# Patient Record
Sex: Female | Born: 1975 | Race: Black or African American | Hispanic: No | Marital: Married | Smoking: Never smoker
Health system: Southern US, Community
[De-identification: ages and names within clinical notes are randomized; demographics above are authoritative.]

## PROBLEM LIST (undated history)

## (undated) DIAGNOSIS — I341 Nonrheumatic mitral (valve) prolapse: Secondary | ICD-10-CM

## (undated) DIAGNOSIS — D649 Anemia, unspecified: Secondary | ICD-10-CM

## (undated) DIAGNOSIS — H109 Unspecified conjunctivitis: Secondary | ICD-10-CM

## (undated) DIAGNOSIS — T7840XA Allergy, unspecified, initial encounter: Secondary | ICD-10-CM

## (undated) DIAGNOSIS — M1990 Unspecified osteoarthritis, unspecified site: Secondary | ICD-10-CM

## (undated) HISTORY — PX: NO PAST SURGERIES: SHX2092

## (undated) HISTORY — DX: Allergy, unspecified, initial encounter: T78.40XA

---

## 1999-06-21 ENCOUNTER — Other Ambulatory Visit: Admission: RE | Admit: 1999-06-21 | Discharge: 1999-06-21 | Payer: Self-pay | Admitting: Obstetrics & Gynecology

## 2000-04-06 ENCOUNTER — Emergency Department (HOSPITAL_COMMUNITY): Admission: EM | Admit: 2000-04-06 | Discharge: 2000-04-06 | Payer: Self-pay | Admitting: Internal Medicine

## 2000-07-02 ENCOUNTER — Other Ambulatory Visit: Admission: RE | Admit: 2000-07-02 | Discharge: 2000-07-02 | Payer: Self-pay | Admitting: Obstetrics & Gynecology

## 2001-07-30 ENCOUNTER — Other Ambulatory Visit: Admission: RE | Admit: 2001-07-30 | Discharge: 2001-07-30 | Payer: Self-pay | Admitting: Obstetrics & Gynecology

## 2001-10-30 ENCOUNTER — Emergency Department (HOSPITAL_COMMUNITY): Admission: EM | Admit: 2001-10-30 | Discharge: 2001-10-30 | Payer: Self-pay | Admitting: Emergency Medicine

## 2001-10-30 ENCOUNTER — Encounter: Payer: Self-pay | Admitting: Emergency Medicine

## 2002-09-25 ENCOUNTER — Other Ambulatory Visit: Admission: RE | Admit: 2002-09-25 | Discharge: 2002-09-25 | Payer: Self-pay | Admitting: Obstetrics & Gynecology

## 2003-09-11 ENCOUNTER — Inpatient Hospital Stay (HOSPITAL_COMMUNITY): Admission: AD | Admit: 2003-09-11 | Discharge: 2003-09-13 | Payer: Self-pay | Admitting: Internal Medicine

## 2003-10-12 ENCOUNTER — Other Ambulatory Visit: Admission: RE | Admit: 2003-10-12 | Discharge: 2003-10-12 | Payer: Self-pay | Admitting: Obstetrics & Gynecology

## 2004-10-12 ENCOUNTER — Ambulatory Visit: Payer: Self-pay | Admitting: Internal Medicine

## 2004-12-01 ENCOUNTER — Other Ambulatory Visit: Admission: RE | Admit: 2004-12-01 | Discharge: 2004-12-01 | Payer: Self-pay | Admitting: Obstetrics & Gynecology

## 2010-06-16 ENCOUNTER — Emergency Department (HOSPITAL_COMMUNITY)
Admission: EM | Admit: 2010-06-16 | Discharge: 2010-06-16 | Payer: Self-pay | Source: Home / Self Care | Admitting: Emergency Medicine

## 2011-11-19 ENCOUNTER — Ambulatory Visit (INDEPENDENT_AMBULATORY_CARE_PROVIDER_SITE_OTHER): Payer: 59 | Admitting: Physician Assistant

## 2011-11-19 VITALS — BP 121/70 | HR 91 | Temp 98.3°F | Resp 16 | Ht 65.4 in | Wt 155.4 lb

## 2011-11-19 DIAGNOSIS — J310 Chronic rhinitis: Secondary | ICD-10-CM

## 2011-11-19 DIAGNOSIS — J029 Acute pharyngitis, unspecified: Secondary | ICD-10-CM

## 2011-11-19 DIAGNOSIS — R066 Hiccough: Secondary | ICD-10-CM

## 2011-11-19 DIAGNOSIS — R05 Cough: Secondary | ICD-10-CM

## 2011-11-19 LAB — POCT RAPID STREP A (OFFICE): Rapid Strep A Screen: NEGATIVE

## 2011-11-19 MED ORDER — IPRATROPIUM BROMIDE 0.03 % NA SOLN: 2.0000 | Freq: Two times a day (BID) | NASAL | Status: DC

## 2011-11-19 MED ORDER — GUAIFENESIN ER 1200 MG PO TB12: 1.0000 | ORAL_TABLET | Freq: Two times a day (BID) | ORAL | Status: DC | PRN

## 2011-11-19 MED ORDER — MAGIC MOUTHWASH W/LIDOCAINE: 10.0000 mL | ORAL | Status: DC | PRN

## 2011-11-19 MED ORDER — HYDROCOD POLST-CHLORPHEN POLST 10-8 MG/5ML PO LQCR: 5.0000 mL | Freq: Two times a day (BID) | ORAL | Status: DC | PRN

## 2011-11-19 NOTE — Patient Instructions (Signed)
Get LOTS of rest and drink at least 64 ounces of water daily.

## 2011-11-19 NOTE — Progress Notes (Signed)
  Subjective:    Patient ID: Kelly Sharp, female    DOB: 1976-02-23, 36 y.o.   MRN: [id number]  HPI Presents with 2 days of sore throat.  No fever, chills, GU/GI symptoms.  No myalgias.  No rash.  Mild nasal congestion.  Cough kept her awake last night.  Exposed to strep last week (reports mom was diagnosed on 11/15/2011).  Reports history of strep throat 2-3 times annually.   Review of Systems As above.    Objective:   Physical Exam  Vital signs noted. Well-developed, well nourished BF who is awake, alert and oriented, in NAD. HEENT: [place]/AT, PERRL, EOMI.  Sclera and conjunctiva are clear.  EAC are patent, TMs are normal in appearance. Nasal mucosa is pink and moist. OP is clear. Neck: supple, non-tender, no lymphadenopathy, thyromegaly. Heart: RRR, no murmur Lungs: CTA Skin: warm and dry without rash.  Results for orders placed in visit on 11/19/11  POCT RAPID STREP A (OFFICE)      Component Value Range   Rapid Strep A Screen Negative  Negative         Assessment & Plan:   1. Acute pharyngitis  POCT rapid strep A, Culture, Group A Strep, Alum & Mag Hydroxide-Simeth (MAGIC MOUTHWASH W/LIDOCAINE) SOLN  2. Rhinitis  ipratropium (ATROVENT) 0.03 % nasal spray, Guaifenesin (MUCINEX MAXIMUM STRENGTH) 1200 MG TB12  3. Cough  Tussionex 5 ml PO Q12 hours prn   Patient Instructions  Get LOTS of rest and drink at least 64 ounces of water daily.

## 2011-11-21 LAB — CULTURE, GROUP A STREP: Organism ID, Bacteria: NORMAL

## 2011-11-23 ENCOUNTER — Encounter: Payer: Self-pay | Admitting: *Deleted

## 2012-04-22 ENCOUNTER — Ambulatory Visit (INDEPENDENT_AMBULATORY_CARE_PROVIDER_SITE_OTHER): Payer: 59 | Admitting: Physician Assistant

## 2012-04-22 VITALS — BP 142/88 | HR 96 | Temp 98.2°F | Resp 18 | Ht 64.5 in | Wt 155.0 lb

## 2012-04-22 DIAGNOSIS — J029 Acute pharyngitis, unspecified: Secondary | ICD-10-CM

## 2012-04-22 DIAGNOSIS — J019 Acute sinusitis, unspecified: Secondary | ICD-10-CM

## 2012-04-22 DIAGNOSIS — R05 Cough: Secondary | ICD-10-CM

## 2012-04-22 LAB — POCT RAPID STREP A (OFFICE): Rapid Strep A Screen: NEGATIVE

## 2012-04-22 MED ORDER — HYDROCOD POLST-CHLORPHEN POLST 10-8 MG/5ML PO LQCR: 5.0000 mL | Freq: Two times a day (BID) | ORAL | Status: DC | PRN

## 2012-04-22 MED ORDER — AZITHROMYCIN 250 MG PO TABS: ORAL_TABLET | ORAL | Status: DC

## 2012-04-22 NOTE — Progress Notes (Signed)
Patient ID: Kelly Sharp MRN: [id number], DOB: 01/28/1976, 36 y.o. Date of Encounter: 04/22/2012, 9:59 AM  Primary Physician: No primary provider on file.  Chief Complaint:  Chief Complaint  Patient presents with  . Sore Throat    scratchy  . Nasal Congestion  . Sinus Problem    since pressure    HPI: 36 y.o. year old female presents with a three to four day history of nasal congestion, post nasal drip, sore throat, sinus pressure, and cough. Afebrile. No chills. Nasal congestion thick and green/yellow. Sinus pressure is the worst symptom along the frontal sinuses. Cough is productive secondary to post nasal drip and not associated with time of day. No shortness of breath or wheezing. Ears feel full, leading to sensation of muffled hearing. Has tried OTC cold preps without success. No GI complaints. Appetite slightly decreased today. Son is sick with similar illness. She is leaving today for a week long trip to [place] with her husband to celebrate their 10 year wedding anniversary.   No recent antibiotics, or recent travels   No leg trauma, sedentary periods, h/o cancer, or tobacco use.  Past Medical History  Diagnosis Date  . Allergy      Home Meds: Prior to Admission medications   Medication Sig Start Date End Date Taking? Authorizing Provider  norgestimate-ethinyl estradiol (ORTHO-CYCLEN,SPRINTEC,PREVIFEM) 0.25-35 MG-MCG tablet Take 1 tablet by mouth daily.   Yes Historical Provider, MD                                Allergies: No Known Allergies  History   Social History  . Marital Status: Single    Spouse Name: N/A    Number of Children: N/A  . Years of Education: N/A   Occupational History  . Not on file.   Social History Main Topics  . Smoking status: Never Smoker   . Smokeless tobacco: Not on file  . Alcohol Use: Yes  . Drug Use: No  . Sexually Active: Yes     1   Other Topics Concern  . Not on file   Social History Narrative  . No  narrative on file     Review of Systems: Constitutional: negative for chills, fever, night sweats or weight changes Cardiovascular: negative for chest pain or palpitations Respiratory: negative for hemoptysis, wheezing, or shortness of breath Abdominal: negative for abdominal pain, nausea, vomiting or diarrhea Dermatological: negative for rash Neurologic: negative for headache   Physical Exam: Blood pressure 142/88, pulse 96, temperature 98.2 F (36.8 C), temperature source Oral, resp. rate 18, height 5' 4.5" (1.638 m), weight 155 lb (70.308 kg), last menstrual period 03/25/2012, SpO2 97.00%., Body mass index is 26.19 kg/(m^2). General: Well developed, well nourished, in no acute distress. Head: Normocephalic, atraumatic, eyes without discharge, sclera non-icteric, nares are congested. Bilateral auditory canals clear, TM's are without perforation, pearly grey with reflective cone of light bilaterally. Serous effusion bilaterally behind TM's. Frontal sinus TTP. Oral cavity moist, dentition normal. Posterior pharynx with post nasal drip and mild erythema. No peritonsillar abscess or tonsillar exudate. Neck: Supple. No thyromegaly. Full ROM. No lymphadenopathy. Lungs: Clear bilaterally to auscultation without wheezes, rales, or rhonchi. Breathing is unlabored.  Heart: RRR with S1 S2. No murmurs, rubs, or gallops appreciated. Msk:  Strength and tone normal for age. Extremities: No clubbing or cyanosis. No edema. Neuro: Alert and oriented X 3. Moves all extremities spontaneously. CNII-XII grossly in tact.  Psych:  Responds to questions appropriately with a normal affect.   Labs: Results for orders placed in visit on 04/22/12  POCT RAPID STREP A (OFFICE)      Component Value Range   Rapid Strep A Screen Negative  Negative    ASSESSMENT AND PLAN:  36 y.o. year old female with sinusitis and cough secondary to post nasal drip -Azithromycin 250 MG #6 2 po first day then 1 po next 4 days no RF,  start if not feeling better in 2-3 days -Tussionex 1 tsp po q 12 hours prn cough #90 mL no RF -Use Atrovent NS as directed, has at home -Mucinex -Tylenol/Motrin prn -Rest/fluids -RTC precautions -RTC 3-5 days if no improvement  Signed, Eula Listen, PA-C 04/22/2012 9:59 AM

## 2013-09-26 ENCOUNTER — Ambulatory Visit (INDEPENDENT_AMBULATORY_CARE_PROVIDER_SITE_OTHER): Payer: 59 | Admitting: Family Medicine

## 2013-09-26 VITALS — BP 110/60 | HR 85 | Temp 98.5°F | Resp 12 | Ht 65.0 in | Wt 163.0 lb

## 2013-09-26 DIAGNOSIS — R058 Other specified cough: Secondary | ICD-10-CM

## 2013-09-26 DIAGNOSIS — R059 Cough, unspecified: Secondary | ICD-10-CM

## 2013-09-26 DIAGNOSIS — J209 Acute bronchitis, unspecified: Secondary | ICD-10-CM

## 2013-09-26 DIAGNOSIS — R05 Cough: Secondary | ICD-10-CM

## 2013-09-26 DIAGNOSIS — R269 Unspecified abnormalities of gait and mobility: Secondary | ICD-10-CM

## 2013-09-26 MED ORDER — HYDROCOD POLST-CHLORPHEN POLST 10-8 MG/5ML PO LQCR: 5.0000 mL | Freq: Two times a day (BID) | ORAL | Status: DC | PRN

## 2013-09-26 MED ORDER — PREDNISONE 20 MG PO TABS: ORAL_TABLET | ORAL | Status: DC

## 2013-09-26 MED ORDER — AMOXICILLIN 875 MG PO TABS: 875.0000 mg | ORAL_TABLET | Freq: Two times a day (BID) | ORAL | Status: DC

## 2013-09-26 MED ORDER — BENZONATATE 100 MG PO CAPS: 100.0000 mg | ORAL_CAPSULE | Freq: Three times a day (TID) | ORAL | Status: DC | PRN

## 2013-09-26 NOTE — Progress Notes (Signed)
Subjective: 38 year old lady who is here with a respiratory tract infection for 2 weeks. Early on she went to see her primary care doctor. She had a sore throat at first, with some head congestion and cough. The throat has gradually resolved but the cough has gotten steadily worse and more persistent, keeping her awake at night. It is not very productive. No foul sputum. Her primary care treat her symptomatically, and the throat improved but the other problems have persisted.  Objective: In early a healthy young lady. Her TMs are normal. Throat clear. Neck supple without significant nodes. She has a intermittent hacking cough. Lungs are clear. Heart regular without murmurs. She has had some chest wall pain, primarily with deep breathing or coughing.  Assessment: Post viral cough syndrome Bronchitis  Plan:  No special studies are being done today. Will treat with a course of antibiotics even though it may still just be viral, since it is been going on for long period. Also giving him a taper of prednisone and cough suppressant medication. Return if worse

## 2013-09-26 NOTE — Patient Instructions (Signed)
Drink plenty of fluids and try to get enough rest.  Take the amoxicillin one twice daily  Use the Tussionex 1 teaspoon every 12 hurts as needed for cough. May wish to avoid this in the daytime because of the drowsiness.  In the daytime and use the Tessalon one or 2 pills 3 times daily. These are not as strong as a Tussionex, but can take the edge off the cough some  Take the prednisone 3 pills daily for 2 days, then 2 daily for 2 days, then one daily for 2 days. These are best taken earlier in the day.  Return if worse

## 2014-03-09 ENCOUNTER — Ambulatory Visit: Payer: 59 | Attending: Orthopedic Surgery

## 2014-03-09 DIAGNOSIS — M25569 Pain in unspecified knee: Secondary | ICD-10-CM | POA: Diagnosis not present

## 2014-03-09 DIAGNOSIS — IMO0001 Reserved for inherently not codable concepts without codable children: Secondary | ICD-10-CM | POA: Insufficient documentation

## 2014-03-09 DIAGNOSIS — M6281 Muscle weakness (generalized): Secondary | ICD-10-CM | POA: Insufficient documentation

## 2014-03-16 ENCOUNTER — Ambulatory Visit: Payer: 59

## 2014-03-16 DIAGNOSIS — IMO0001 Reserved for inherently not codable concepts without codable children: Secondary | ICD-10-CM | POA: Diagnosis not present

## 2014-03-18 ENCOUNTER — Ambulatory Visit: Payer: 59 | Admitting: Rehabilitation

## 2014-03-18 DIAGNOSIS — IMO0001 Reserved for inherently not codable concepts without codable children: Secondary | ICD-10-CM | POA: Diagnosis not present

## 2014-03-25 ENCOUNTER — Ambulatory Visit: Payer: 59 | Attending: Orthopedic Surgery

## 2014-03-25 DIAGNOSIS — M6281 Muscle weakness (generalized): Secondary | ICD-10-CM | POA: Insufficient documentation

## 2014-03-25 DIAGNOSIS — M25562 Pain in left knee: Secondary | ICD-10-CM | POA: Diagnosis not present

## 2014-03-25 DIAGNOSIS — Z5189 Encounter for other specified aftercare: Secondary | ICD-10-CM | POA: Diagnosis not present

## 2014-03-30 ENCOUNTER — Ambulatory Visit: Payer: 59 | Admitting: Rehabilitation

## 2014-03-30 DIAGNOSIS — Z5189 Encounter for other specified aftercare: Secondary | ICD-10-CM | POA: Diagnosis not present

## 2016-06-04 ENCOUNTER — Ambulatory Visit: Payer: Self-pay | Admitting: Orthopedic Surgery

## 2016-06-06 ENCOUNTER — Ambulatory Visit: Payer: Self-pay | Admitting: Orthopedic Surgery

## 2016-06-06 NOTE — H&P (Signed)
Kelly Sharp is an 40 y.o. female.   Chief Complaint: back and RLE pain HPI: The patient is a 40 year old female who presents today for follow up of their back. The patient is being followed for their low back symptoms. They are now 9 week(s) out from when symptoms began. Symptoms reported today include: pain. Current treatment includes: activity modification, NSAIDs and pain medications. The following medication has been used for pain control: Norco and Advil. The patient presents today following MRI.  Kelly Sharp follows up with an MRI. She does have a moderately large disc herniation L5-S1 to the right, displacing the S1 nerve root.  She has had physical therapy. She has had a relapse. She has been unable to return to work.  Past Medical History:  Diagnosis Date  . Allergy     No past surgical history on file.  Family History  Problem Relation Age of Onset  . Thyroid disease Mother   . Diabetes Father   . Hyperlipidemia Father   . Diabetes Maternal Grandmother   . Diabetes Paternal Grandmother    Social History:  reports that she has never smoked. She has never used smokeless tobacco. She reports that she drinks alcohol. She reports that she does not use drugs.  Allergies: No Known Allergies   (Not in a hospital admission)  No results found for this or any previous visit (from the past 48 hour(s)). No results found.  Review of Systems  Constitutional: Negative.   HENT: Negative.   Eyes: Negative.   Respiratory: Negative.   Cardiovascular: Negative.   Gastrointestinal: Negative.   Genitourinary: Negative.   Musculoskeletal: Positive for back pain.  Skin: Negative.   Neurological: Positive for sensory change and focal weakness.  Psychiatric/Behavioral: Negative.     There were no vitals taken for this visit. Physical Exam  Constitutional: She is oriented to person, place, and time. She appears well-developed and well-nourished. She appears distressed.  HENT:   Head: Normocephalic.  Eyes: Pupils are equal, round, and reactive to light.  Neck: Normal range of motion.  Cardiovascular: Normal rate.   Respiratory: Effort normal.  GI: Soft.  Musculoskeletal:  Healthy. Moderate to severe distress. Mood and affect is appropriate. Her straight leg raises buttock, thigh and calf pain on the right and is positive at 10 degrees, negative on the left. She has trace EHL weakness on plantar flexion and weakness on the right compared to the left, pain with flexion. She can only relieve symptoms in the prone position.  Neurological: She is alert and oriented to person, place, and time.  Skin: Skin is warm and dry.    MRI demonstrates a neurocompressive lesion and disc herniation at 5-1 displacing it to the right, I feel it is moderately large. She has associated disc degeneration.  Assessment/Plan 1. 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 2. 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  We discussed her pathology, relevant anatomy and treatment at this point in time. Given that she is nine weeks status post she has had exacerbation and has a markedly positive neuro tension signs with early myotomal weakness I feel that microlumbar decompression at this point in time would be appropriate option. She is to remain out of work, has not been able to return. I do not feel a corticosteroid injection would be beneficial as there is neural compression at this point. Again she is nine weeks status post I gave  her note to be out of work, discussed lumbar decompression in detail. I had an extensive discussion of the risks and benefits of the lumbar decompression with the patient including bleeding, infection, damage to neurovascular structures, epidural fibrosis, CSF leak requiring repair. We also discussed increase in pain, adjacent segment disease, recurrent disc herniation, need for future  surgery including repeat decompression and/or fusion. We also discussed risks of postoperative hematoma, paralysis, anesthetic complications including DVT, PE, death, cardiopulmonary dysfunction. In addition, the perioperative and postoperative courses were discussed in detail including the rehabilitative time and return to functional activity and work. I provided the patient with an illustrated handout and utilized the appropriate surgical models. We will keep a note to be out of work. She is to avoid stretching of the nerve. At work when she returns, we discussed appropriate ergonomic, standing desk, etc. Any change in the interim, she is to call or her husband call in as well and we spoke with him. Her son was here as well. Again, she is fairly miserable and has had nine weeks of persistent symptoms despite physical therapy, activity modification, home exercise program, etc.  Plan microlumbar decompression L5-S1 right  Kelly Sharp., PA-C  For Dr. Tonita Cong 06/06/2016, 1:26 PM

## 2016-06-13 ENCOUNTER — Encounter (HOSPITAL_COMMUNITY): Payer: Self-pay

## 2016-06-13 NOTE — Patient Instructions (Signed)
Kelly Sharp  06/13/2016   Your procedure is scheduled on: 06/20/16  Report to [facility] Main  Entrance take [facility]  elevators to 3rd floor to  [place] at     11:00 AM.  Call this number if you have problems the morning of surgery [phone]   Remember: ONLY 1 PERSON MAY GO WITH YOU TO SHORT STAY TO GET  READY MORNING OF [place].  Do not eat food or drink liquids :After Midnight.     Take these medicines the morning of surgery with A SIP OF WATER: Hydrocodone if needed, eye drops                                You may not have any metal on your body including hair pins and              piercings  Do not wear jewelry, make-up, lotions, powders or perfumes, deodorant             Do not wear nail polish.  Do not shave  48 hours prior to surgery.          Do not bring valuables to the hospital. [place].  Contacts, dentures or bridgework may not be worn into surgery.  Leave suitcase in the car. After surgery it may be brought to your room.        Special Instructions: N/A              Please read over the following fact sheets you were given: _____________________________________________________________________             [facility] - Preparing for Surgery Before surgery, you can play an important role.  Because skin is not sterile, your skin needs to be as free of germs as possible.  You can reduce the number of germs on your skin by washing with CHG (chlorahexidine gluconate) soap before surgery.  CHG is an antiseptic cleaner which kills germs and bonds with the skin to continue killing germs even after washing. Please DO NOT use if you have an allergy to CHG or antibacterial soaps.  If your skin becomes reddened/irritated stop using the CHG and inform your nurse when you arrive at Short Stay. Do not shave (including legs and underarms) for at least 48 hours prior to the first CHG  shower.  You may shave your face/neck. Please follow these instructions carefully:  1.  Shower with CHG Soap the night before surgery and the  morning of Surgery.  2.  If you choose to wash your hair, wash your hair first as usual with your  normal  shampoo.  3.  After you shampoo, rinse your hair and body thoroughly to remove the  shampoo.                           4.  Use CHG as you would any other liquid soap.  You can apply chg directly  to the skin and wash                       Gently with a scrungie or clean washcloth.  5.  Apply the CHG  Soap to your body ONLY FROM THE NECK DOWN.   Do not use on face/ open                           Wound or open sores. Avoid contact with eyes, ears mouth and genitals (private parts).                       Wash face,  Genitals (private parts) with your normal soap.             6.  Wash thoroughly, paying special attention to the area where your surgery  will be performed.  7.  Thoroughly rinse your body with warm water from the neck down.  8.  DO NOT shower/wash with your normal soap after using and rinsing off  the CHG Soap.                9.  Pat yourself dry with a clean towel.            10.  Wear clean pajamas.            11.  Place clean sheets on your bed the night of your first shower and do not  sleep with pets. Day of Surgery : Do not apply any lotions/deodorants the morning of surgery.  Please wear clean clothes to the hospital/surgery center.  FAILURE TO FOLLOW THESE INSTRUCTIONS MAY RESULT IN THE CANCELLATION OF YOUR SURGERY PATIENT SIGNATURE_________________________________  NURSE SIGNATURE__________________________________  ________________________________________________________________________  WHAT IS A BLOOD TRANSFUSION? Blood Transfusion Information  A transfusion is the replacement of blood or some of its parts. Blood is made up of multiple cells which provide different functions.  Red blood cells carry oxygen and are used for  blood loss replacement.  White blood cells fight against infection.  Platelets control bleeding.  Plasma helps clot blood.  Other blood products are available for specialized needs, such as hemophilia or other clotting disorders. BEFORE THE TRANSFUSION  Who gives blood for transfusions?   Healthy volunteers who are fully evaluated to make sure their blood is safe. This is blood bank blood. Transfusion therapy is the safest it has ever been in the practice of medicine. Before blood is taken from a donor, a complete history is taken to make sure that person has no history of diseases nor engages in risky social behavior (examples are intravenous drug use or sexual activity with multiple partners). The donor's travel history is screened to minimize risk of transmitting infections, such as malaria. The donated blood is tested for signs of infectious diseases, such as HIV and hepatitis. The blood is then tested to be sure it is compatible with you in order to minimize the chance of a transfusion reaction. If you or a relative donates blood, this is often done in anticipation of surgery and is not appropriate for emergency situations. It takes many days to process the donated blood. RISKS AND COMPLICATIONS Although transfusion therapy is very safe and saves many lives, the main dangers of transfusion include:   Getting an infectious disease.  Developing a transfusion reaction. This is an allergic reaction to something in the blood you were given. Every precaution is taken to prevent this. The decision to have a blood transfusion has been considered carefully by your caregiver before blood is given. Blood is not given unless the benefits outweigh the risks. AFTER THE TRANSFUSION  Right after receiving a blood transfusion, you  will usually feel much better and more energetic. This is especially true if your red blood cells have gotten low (anemic). The transfusion raises the level of the red blood  cells which carry oxygen, and this usually causes an energy increase.  The nurse administering the transfusion will monitor you carefully for complications. HOME CARE INSTRUCTIONS  No special instructions are needed after a transfusion. You may find your energy is better. Speak with your caregiver about any limitations on activity for underlying diseases you may have. SEEK MEDICAL CARE IF:   Your condition is not improving after your transfusion.  You develop redness or irritation at the intravenous (IV) site. SEEK IMMEDIATE MEDICAL CARE IF:  Any of the following symptoms occur over the next 12 hours:  Shaking chills.  You have a temperature by mouth above 102 F (38.9 C), not controlled by medicine.  Chest, back, or muscle pain.  People around you feel you are not acting correctly or are confused.  Shortness of breath or difficulty breathing.  Dizziness and fainting.  You get a rash or develop hives.  You have a decrease in urine output.  Your urine turns a dark color or changes to pink, red, or brown. Any of the following symptoms occur over the next 10 days:  You have a temperature by mouth above 102 F (38.9 C), not controlled by medicine.  Shortness of breath.  Weakness after normal activity.  The white part of the eye turns yellow (jaundice).  You have a decrease in the amount of urine or are urinating less often.  Your urine turns a dark color or changes to pink, red, or brown. Document Released: 06/08/2000 Document Revised: 09/03/2011 Document Reviewed: 01/26/2008 ExitCare Patient Information 2014 [place].  _______________________________________________________________________  Incentive Spirometer  An incentive spirometer is a tool that can help keep your lungs clear and active. This tool measures how well you are filling your lungs with each breath. Taking long deep breaths may help reverse or decrease the chance of developing breathing  (pulmonary) problems (especially infection) following:  A long period of time when you are unable to move or be active. BEFORE THE PROCEDURE   If the spirometer includes an indicator to show your best effort, your nurse or respiratory therapist will set it to a desired goal.  If possible, sit up straight or lean slightly forward. Try not to slouch.  Hold the incentive spirometer in an upright position. INSTRUCTIONS FOR USE  1. Sit on the edge of your bed if possible, or sit up as far as you can in bed or on a chair. 2. Hold the incentive spirometer in an upright position. 3. Breathe out normally. 4. Place the mouthpiece in your mouth and seal your lips tightly around it. 5. Breathe in slowly and as deeply as possible, raising the piston or the ball toward the top of the column. 6. Hold your breath for 3-5 seconds or for as long as possible. Allow the piston or ball to fall to the bottom of the column. 7. Remove the mouthpiece from your mouth and breathe out normally. 8. Rest for a few seconds and repeat Steps 1 through 7 at least 10 times every 1-2 hours when you are awake. Take your time and take a few normal breaths between deep breaths. 9. The spirometer may include an indicator to show your best effort. Use the indicator as a goal to work toward during each repetition. 10. After each set of 10 deep breaths, practice  coughing to be sure your lungs are clear. If you have an incision (the cut made at the time of surgery), support your incision when coughing by placing a pillow or rolled up towels firmly against it. Once you are able to get out of bed, walk around indoors and cough well. You may stop using the incentive spirometer when instructed by your caregiver.  RISKS AND COMPLICATIONS  Take your time so you do not get dizzy or light-headed.  If you are in pain, you may need to take or ask for pain medication before doing incentive spirometry. It is harder to take a deep breath if you  are having pain. AFTER USE  Rest and breathe slowly and easily.  It can be helpful to keep track of a log of your progress. Your caregiver can provide you with a simple table to help with this. If you are using the spirometer at home, follow these instructions: [place] IF:   You are having difficultly using the spirometer.  You have trouble using the spirometer as often as instructed.  Your pain medication is not giving enough relief while using the spirometer.  You develop fever of 100.5 F (38.1 C) or higher. SEEK IMMEDIATE MEDICAL CARE IF:   You cough up bloody sputum that had not been present before.  You develop fever of 102 F (38.9 C) or greater.  You develop worsening pain at or near the incision site. MAKE SURE YOU:   Understand these instructions.  Will watch your condition.  Will get help right away if you are not doing well or get worse. Document Released: 10/22/2006 Document Revised: 09/03/2011 Document Reviewed: 12/23/2006 [facility] Patient Information 2014 [place], [place].   ________________________________________________________________________

## 2016-06-14 ENCOUNTER — Encounter (HOSPITAL_COMMUNITY): Payer: Self-pay

## 2016-06-14 ENCOUNTER — Ambulatory Visit (HOSPITAL_COMMUNITY)
Admission: RE | Admit: 2016-06-14 | Discharge: 2016-06-14 | Disposition: A | Discharge status: Home / Self Care | Payer: 59 | Source: Ambulatory Visit | Attending: Orthopedic Surgery | Admitting: Orthopedic Surgery

## 2016-06-14 ENCOUNTER — Encounter (HOSPITAL_COMMUNITY)
Admission: RE | Admit: 2016-06-14 | Discharge: 2016-06-14 | Disposition: A | Discharge status: Home / Self Care | Payer: 59 | Source: Ambulatory Visit | Attending: Specialist | Admitting: Specialist

## 2016-06-14 DIAGNOSIS — Z01812 Encounter for preprocedural laboratory examination: Secondary | ICD-10-CM | POA: Diagnosis present

## 2016-06-14 DIAGNOSIS — M5126 Other intervertebral disc displacement, lumbar region: Secondary | ICD-10-CM | POA: Diagnosis not present

## 2016-06-14 DIAGNOSIS — Z01818 Encounter for other preprocedural examination: Secondary | ICD-10-CM | POA: Diagnosis present

## 2016-06-14 HISTORY — DX: Nonrheumatic mitral (valve) prolapse: I34.1

## 2016-06-14 HISTORY — DX: Unspecified conjunctivitis: H10.9

## 2016-06-14 HISTORY — DX: Unspecified osteoarthritis, unspecified site: M19.90

## 2016-06-14 LAB — CBC
HEMATOCRIT: 38.2 % (ref 36.0–46.0)
Hemoglobin: 12.2 g/dL (ref 12.0–15.0)
MCH: 27.4 pg (ref 26.0–34.0)
MCHC: 31.9 g/dL (ref 30.0–36.0)
MCV: 85.8 fL (ref 78.0–100.0)
Platelets: 385 10*3/uL (ref 150–400)
RBC: 4.45 MIL/uL (ref 3.87–5.11)
RDW: 13 % (ref 11.5–15.5)
WBC: 5.9 10*3/uL (ref 4.0–10.5)

## 2016-06-14 LAB — ABO/RH: ABO/RH(D): A POS

## 2016-06-14 LAB — BASIC METABOLIC PANEL
ANION GAP: 7 (ref 5–15)
BUN: 11 mg/dL (ref 6–20)
CO2: 29 mmol/L (ref 22–32)
Calcium: 9.1 mg/dL (ref 8.9–10.3)
Chloride: 101 mmol/L (ref 101–111)
Creatinine, Ser: 0.77 mg/dL (ref 0.44–1.00)
Glucose, Bld: 99 mg/dL (ref 65–99)
POTASSIUM: 4.1 mmol/L (ref 3.5–5.1)
SODIUM: 137 mmol/L (ref 135–145)

## 2016-06-14 LAB — SURGICAL PCR SCREEN
MRSA, PCR: NEGATIVE
STAPHYLOCOCCUS AUREUS: NEGATIVE

## 2016-06-14 LAB — PREGNANCY, URINE: Preg Test, Ur: NEGATIVE

## 2016-06-14 NOTE — Progress Notes (Signed)
Patient reported history of pink eye treated by Dr Burman Blacksmith at [place] approximately 05/30/2016.  Patient on eye antibiotic solution.  Patient reports eye improved but felt on 06/10/16 that eye " felt funny " and started using eye antibiotic solution again that had been prescribed by MD.  Instructed patient to call and inform Judeen Hammans, Cabin crew for [employer].  Phone number and extension provided.

## 2016-06-20 ENCOUNTER — Ambulatory Visit (HOSPITAL_COMMUNITY): Payer: 59

## 2016-06-20 ENCOUNTER — Encounter (HOSPITAL_COMMUNITY)
Admission: RE | Disposition: A | Discharge status: Home / Self Care | Payer: Self-pay | Source: Ambulatory Visit | Attending: Specialist

## 2016-06-20 ENCOUNTER — Ambulatory Visit (HOSPITAL_COMMUNITY): Payer: 59 | Admitting: Anesthesiology

## 2016-06-20 ENCOUNTER — Ambulatory Visit (HOSPITAL_COMMUNITY)
Admission: RE | Admit: 2016-06-20 | Discharge: 2016-06-22 | Disposition: A | Discharge status: Home / Self Care | Payer: 59 | Source: Ambulatory Visit | Attending: Specialist | Admitting: Specialist

## 2016-06-20 ENCOUNTER — Encounter (HOSPITAL_COMMUNITY): Payer: Self-pay

## 2016-06-20 DIAGNOSIS — M5117 Intervertebral disc disorders with radiculopathy, lumbosacral region: Secondary | ICD-10-CM | POA: Insufficient documentation

## 2016-06-20 DIAGNOSIS — M48061 Spinal stenosis, lumbar region without neurogenic claudication: Secondary | ICD-10-CM | POA: Diagnosis present

## 2016-06-20 DIAGNOSIS — Z419 Encounter for procedure for purposes other than remedying health state, unspecified: Secondary | ICD-10-CM

## 2016-06-20 DIAGNOSIS — M1712 Unilateral primary osteoarthritis, left knee: Secondary | ICD-10-CM | POA: Insufficient documentation

## 2016-06-20 DIAGNOSIS — M5126 Other intervertebral disc displacement, lumbar region: Secondary | ICD-10-CM

## 2016-06-20 HISTORY — PX: DECOMPRESSIVE LUMBAR LAMINECTOMY LEVEL 1: SHX5791

## 2016-06-20 LAB — TYPE AND SCREEN
ABO/RH(D): A POS
ANTIBODY SCREEN: NEGATIVE

## 2016-06-20 SURGERY — DECOMPRESSIVE LUMBAR LAMINECTOMY LEVEL 1
Anesthesia: General | Site: Back | Laterality: Right

## 2016-06-20 MED ORDER — MIDAZOLAM HCL 2 MG/2ML IJ SOLN
INTRAMUSCULAR | Status: AC
  Filled 2016-06-20: qty 2

## 2016-06-20 MED ORDER — SUCCINYLCHOLINE CHLORIDE 200 MG/10ML IV SOSY
PREFILLED_SYRINGE | INTRAVENOUS | Status: AC
  Filled 2016-06-20: qty 10

## 2016-06-20 MED ORDER — LIP MEDEX EX OINT
TOPICAL_OINTMENT | CUTANEOUS | Status: AC
  Filled 2016-06-20: qty 7

## 2016-06-20 MED ORDER — POLYETHYLENE GLYCOL 3350 17 G PO PACK: 17.0000 g | PACK | Freq: Every day | ORAL | 0 refills | Status: DC

## 2016-06-20 MED ORDER — OXYCODONE-ACETAMINOPHEN 5-325 MG PO TABS: 1.0000 | ORAL_TABLET | ORAL | 0 refills | Status: DC | PRN

## 2016-06-20 MED ORDER — PROMETHAZINE HCL 25 MG/ML IJ SOLN
6.2500 mg | INTRAMUSCULAR | Status: DC | PRN
  Administered 2016-06-20: 23:00:00 12.5 mg via INTRAVENOUS
  Filled 2016-06-20: qty 1

## 2016-06-20 MED ORDER — LIDOCAINE-EPINEPHRINE (PF) 1 %-1:200000 IJ SOLN
INTRAMUSCULAR | Status: AC
  Filled 2016-06-20: qty 30

## 2016-06-20 MED ORDER — THROMBIN 5000 UNITS EX SOLR
CUTANEOUS | Status: AC
  Filled 2016-06-20: qty 10000

## 2016-06-20 MED ORDER — PHENOL 1.4 % MT LIQD: 1.0000 | OROMUCOSAL | Status: DC | PRN

## 2016-06-20 MED ORDER — POLYETHYLENE GLYCOL 3350 17 G PO PACK
17.0000 g | PACK | Freq: Every day | ORAL | Status: DC | PRN
  Administered 2016-06-22: 17 g via ORAL
  Filled 2016-06-20: qty 1

## 2016-06-20 MED ORDER — CEFAZOLIN SODIUM-DEXTROSE 2-4 GM/100ML-% IV SOLN
2.0000 g | Freq: Three times a day (TID) | INTRAVENOUS | Status: AC
  Administered 2016-06-20 – 2016-06-21 (×3): 2 g via INTRAVENOUS
  Filled 2016-06-20 (×3): qty 100

## 2016-06-20 MED ORDER — BISACODYL 5 MG PO TBEC: 5.0000 mg | DELAYED_RELEASE_TABLET | Freq: Every day | ORAL | Status: DC | PRN

## 2016-06-20 MED ORDER — PROPOFOL 10 MG/ML IV BOLUS
INTRAVENOUS | Status: DC | PRN
  Administered 2016-06-20: 160 mg via INTRAVENOUS

## 2016-06-20 MED ORDER — MAGNESIUM CITRATE PO SOLN: 1.0000 | Freq: Once | ORAL | Status: DC | PRN

## 2016-06-20 MED ORDER — HYDROMORPHONE HCL 1 MG/ML IJ SOLN
0.5000 mg | INTRAMUSCULAR | Status: DC | PRN
  Administered 2016-06-20 (×2): 1 mg via INTRAVENOUS
  Filled 2016-06-20 (×3): qty 1

## 2016-06-20 MED ORDER — PHENYLEPHRINE HCL 10 MG/ML IJ SOLN
INTRAMUSCULAR | Status: DC | PRN
  Administered 2016-06-20 (×2): 80 ug via INTRAVENOUS

## 2016-06-20 MED ORDER — SODIUM CHLORIDE 0.9 % IR SOLN
Status: DC | PRN
  Administered 2016-06-20: 500 mL

## 2016-06-20 MED ORDER — LIDOCAINE-EPINEPHRINE (PF) 1 %-1:200000 IJ SOLN
INTRAMUSCULAR | Status: DC | PRN
  Administered 2016-06-20: 11 mL

## 2016-06-20 MED ORDER — HYDROMORPHONE HCL 1 MG/ML IJ SOLN
0.2500 mg | INTRAMUSCULAR | Status: DC | PRN
  Administered 2016-06-20 (×4): 0.5 mg via INTRAVENOUS

## 2016-06-20 MED ORDER — KCL IN DEXTROSE-NACL 20-5-0.45 MEQ/L-%-% IV SOLN
INTRAVENOUS | Status: AC
  Administered 2016-06-20 (×2): via INTRAVENOUS
  Filled 2016-06-20: qty 1000

## 2016-06-20 MED ORDER — METHOCARBAMOL 500 MG PO TABS: 500.0000 mg | ORAL_TABLET | Freq: Four times a day (QID) | ORAL | 1 refills | Status: DC | PRN

## 2016-06-20 MED ORDER — PROBIOTIC PO CAPS: ORAL_CAPSULE | Freq: Every day | ORAL | Status: DC

## 2016-06-20 MED ORDER — SUGAMMADEX SODIUM 200 MG/2ML IV SOLN
INTRAVENOUS | Status: DC | PRN
  Administered 2016-06-20: 200 mg via INTRAVENOUS

## 2016-06-20 MED ORDER — FENTANYL CITRATE (PF) 100 MCG/2ML IJ SOLN
INTRAMUSCULAR | Status: DC | PRN
  Administered 2016-06-20 (×2): 50 ug via INTRAVENOUS

## 2016-06-20 MED ORDER — SUGAMMADEX SODIUM 200 MG/2ML IV SOLN
INTRAVENOUS | Status: AC
  Filled 2016-06-20: qty 2

## 2016-06-20 MED ORDER — NORGESTIM-ETH ESTRAD TRIPHASIC 0.18/0.215/0.25 MG-35 MCG PO TABS
1.0000 | ORAL_TABLET | Freq: Every evening | ORAL | Status: DC
  Administered 2016-06-21: 1 via ORAL

## 2016-06-20 MED ORDER — THROMBIN 5000 UNITS EX SOLR
OROMUCOSAL | Status: DC | PRN
  Administered 2016-06-20: 10 mL via TOPICAL

## 2016-06-20 MED ORDER — ACETAMINOPHEN 650 MG RE SUPP: 650.0000 mg | RECTAL | Status: DC | PRN

## 2016-06-20 MED ORDER — ROCURONIUM BROMIDE 10 MG/ML (PF) SYRINGE
PREFILLED_SYRINGE | INTRAVENOUS | Status: DC | PRN
  Administered 2016-06-20: 40 mg via INTRAVENOUS

## 2016-06-20 MED ORDER — ONDANSETRON HCL 4 MG/2ML IJ SOLN
4.0000 mg | INTRAMUSCULAR | Status: DC | PRN
  Administered 2016-06-20 (×2): 4 mg via INTRAVENOUS
  Filled 2016-06-20 (×2): qty 2

## 2016-06-20 MED ORDER — CEFAZOLIN SODIUM-DEXTROSE 2-4 GM/100ML-% IV SOLN
INTRAVENOUS | Status: AC
  Filled 2016-06-20: qty 100

## 2016-06-20 MED ORDER — DEXAMETHASONE SODIUM PHOSPHATE 10 MG/ML IJ SOLN
INTRAMUSCULAR | Status: DC | PRN
  Administered 2016-06-20: 10 mg via INTRAVENOUS

## 2016-06-20 MED ORDER — DEXAMETHASONE SODIUM PHOSPHATE 10 MG/ML IJ SOLN
INTRAMUSCULAR | Status: AC
  Filled 2016-06-20: qty 1

## 2016-06-20 MED ORDER — FENTANYL CITRATE (PF) 100 MCG/2ML IJ SOLN
INTRAMUSCULAR | Status: AC
  Filled 2016-06-20: qty 2

## 2016-06-20 MED ORDER — ALUM & MAG HYDROXIDE-SIMETH 200-200-20 MG/5ML PO SUSP: 30.0000 mL | Freq: Four times a day (QID) | ORAL | Status: DC | PRN

## 2016-06-20 MED ORDER — HYDROMORPHONE HCL 1 MG/ML IJ SOLN
INTRAMUSCULAR | Status: AC
  Filled 2016-06-20: qty 1

## 2016-06-20 MED ORDER — ROCURONIUM BROMIDE 50 MG/5ML IV SOSY
PREFILLED_SYRINGE | INTRAVENOUS | Status: AC
  Filled 2016-06-20: qty 5

## 2016-06-20 MED ORDER — ONDANSETRON HCL 4 MG/2ML IJ SOLN
INTRAMUSCULAR | Status: DC | PRN
  Administered 2016-06-20: 4 mg via INTRAVENOUS

## 2016-06-20 MED ORDER — DOCUSATE SODIUM 100 MG PO CAPS: 100.0000 mg | ORAL_CAPSULE | Freq: Two times a day (BID) | ORAL | 1 refills | Status: DC | PRN

## 2016-06-20 MED ORDER — SUCCINYLCHOLINE CHLORIDE 200 MG/10ML IV SOSY
PREFILLED_SYRINGE | INTRAVENOUS | Status: DC | PRN
  Administered 2016-06-20: 120 mg via INTRAVENOUS

## 2016-06-20 MED ORDER — OXYCODONE-ACETAMINOPHEN 5-325 MG PO TABS
1.0000 | ORAL_TABLET | ORAL | Status: DC | PRN
  Administered 2016-06-21: 15:00:00 1 via ORAL
  Administered 2016-06-21: 21:00:00 2 via ORAL
  Administered 2016-06-21: 16:00:00 1 via ORAL
  Administered 2016-06-22 (×2): 2 via ORAL
  Filled 2016-06-20 (×2): qty 1
  Filled 2016-06-20 (×3): qty 2

## 2016-06-20 MED ORDER — MENTHOL 3 MG MT LOZG
1.0000 | LOZENGE | OROMUCOSAL | Status: DC | PRN
  Filled 2016-06-20 (×2): qty 9

## 2016-06-20 MED ORDER — LIDOCAINE 2% (20 MG/ML) 5 ML SYRINGE
INTRAMUSCULAR | Status: DC | PRN
  Administered 2016-06-20: 100 mg via INTRAVENOUS

## 2016-06-20 MED ORDER — MIDAZOLAM HCL 5 MG/5ML IJ SOLN
INTRAMUSCULAR | Status: DC | PRN
  Administered 2016-06-20: 2 mg via INTRAVENOUS

## 2016-06-20 MED ORDER — LIDOCAINE 2% (20 MG/ML) 5 ML SYRINGE
INTRAMUSCULAR | Status: AC
  Filled 2016-06-20: qty 5

## 2016-06-20 MED ORDER — CEFAZOLIN SODIUM-DEXTROSE 2-4 GM/100ML-% IV SOLN
2.0000 g | INTRAVENOUS | Status: AC
  Administered 2016-06-20: 2 g via INTRAVENOUS

## 2016-06-20 MED ORDER — HYDROCODONE-ACETAMINOPHEN 5-325 MG PO TABS
1.0000 | ORAL_TABLET | ORAL | Status: DC | PRN
  Administered 2016-06-21: 11:00:00 2 via ORAL
  Administered 2016-06-21 (×2): 1 via ORAL
  Filled 2016-06-20 (×2): qty 2
  Filled 2016-06-20: qty 1

## 2016-06-20 MED ORDER — PHENYLEPHRINE 40 MCG/ML (10ML) SYRINGE FOR IV PUSH (FOR BLOOD PRESSURE SUPPORT)
PREFILLED_SYRINGE | INTRAVENOUS | Status: AC
  Filled 2016-06-20: qty 10

## 2016-06-20 MED ORDER — ONDANSETRON HCL 4 MG/2ML IJ SOLN
INTRAMUSCULAR | Status: AC
  Filled 2016-06-20: qty 2

## 2016-06-20 MED ORDER — METHOCARBAMOL 1000 MG/10ML IJ SOLN
500.0000 mg | Freq: Four times a day (QID) | INTRAMUSCULAR | Status: DC | PRN
  Administered 2016-06-20: 500 mg via INTRAVENOUS
  Filled 2016-06-20: qty 5
  Filled 2016-06-20: qty 550

## 2016-06-20 MED ORDER — PROPOFOL 10 MG/ML IV BOLUS
INTRAVENOUS | Status: AC
  Filled 2016-06-20: qty 20

## 2016-06-20 MED ORDER — DOCUSATE SODIUM 100 MG PO CAPS
100.0000 mg | ORAL_CAPSULE | Freq: Two times a day (BID) | ORAL | Status: DC
  Administered 2016-06-21 – 2016-06-22 (×3): 100 mg via ORAL
  Filled 2016-06-20 (×4): qty 1

## 2016-06-20 MED ORDER — RISAQUAD PO CAPS
1.0000 | ORAL_CAPSULE | Freq: Every day | ORAL | Status: DC
  Administered 2016-06-21: 21:00:00 1 via ORAL
  Filled 2016-06-20 (×2): qty 1

## 2016-06-20 MED ORDER — METHOCARBAMOL 500 MG PO TABS
500.0000 mg | ORAL_TABLET | Freq: Four times a day (QID) | ORAL | Status: DC | PRN
  Administered 2016-06-21 – 2016-06-22 (×4): 500 mg via ORAL
  Filled 2016-06-20 (×4): qty 1

## 2016-06-20 MED ORDER — POLYMYXIN B SULFATE 500000 UNITS IJ SOLR
INTRAMUSCULAR | Status: AC
  Filled 2016-06-20: qty 500000

## 2016-06-20 MED ORDER — LACTATED RINGERS IV SOLN
INTRAVENOUS | Status: DC | PRN
  Administered 2016-06-20 (×2): via INTRAVENOUS

## 2016-06-20 MED ORDER — ACETAMINOPHEN 325 MG PO TABS
650.0000 mg | ORAL_TABLET | ORAL | Status: DC | PRN
  Administered 2016-06-21: 08:00:00 650 mg via ORAL
  Filled 2016-06-20: qty 2

## 2016-06-20 MED ORDER — BUPIVACAINE HCL (PF) 0.5 % IJ SOLN
INTRAMUSCULAR | Status: AC
  Filled 2016-06-20: qty 30

## 2016-06-20 SURGICAL SUPPLY — 48 items
BAG SPEC THK2 15X12 ZIP CLS (MISCELLANEOUS)
BAG ZIPLOCK 12X15 (MISCELLANEOUS) IMPLANT
CLOSURE WOUND 1/2 X4 (GAUZE/BANDAGES/DRESSINGS) ×1
CLOTH 2% CHLOROHEXIDINE 3PK (PERSONAL CARE ITEMS) ×3 IMPLANT
DRAPE MICROSCOPE LEICA (MISCELLANEOUS) ×3 IMPLANT
DRAPE SHEET LG 3/4 BI-LAMINATE (DRAPES) ×2 IMPLANT
DRAPE SURG 17X11 SM STRL (DRAPES) ×3 IMPLANT
DRAPE UTILITY XL STRL (DRAPES) ×3 IMPLANT
DRSG AQUACEL AG ADV 3.5X 4 (GAUZE/BANDAGES/DRESSINGS) IMPLANT
DRSG AQUACEL AG ADV 3.5X 6 (GAUZE/BANDAGES/DRESSINGS) ×2 IMPLANT
DURAPREP 26ML APPLICATOR (WOUND CARE) ×3 IMPLANT
DURASEAL SPINE SEALANT 3ML (MISCELLANEOUS) IMPLANT
ELECT BLADE TIP CTD 4 INCH (ELECTRODE) ×3 IMPLANT
ELECT REM PT RETURN 9FT ADLT (ELECTROSURGICAL) ×3
ELECTRODE REM PT RTRN 9FT ADLT (ELECTROSURGICAL) ×1 IMPLANT
GLOVE BIOGEL PI IND STRL 7.0 (GLOVE) ×1 IMPLANT
GLOVE BIOGEL PI IND STRL 7.5 (GLOVE) IMPLANT
GLOVE BIOGEL PI INDICATOR 7.0 (GLOVE) ×2
GLOVE BIOGEL PI INDICATOR 7.5 (GLOVE) ×2
GLOVE SURG SS PI 7.0 STRL IVOR (GLOVE) ×3 IMPLANT
GLOVE SURG SS PI 7.5 STRL IVOR (GLOVE) ×5 IMPLANT
GLOVE SURG SS PI 8.0 STRL IVOR (GLOVE) ×6 IMPLANT
GOWN SPEC L3 XXLG W/TWL (GOWN DISPOSABLE) ×2 IMPLANT
GOWN STRL REUS W/TWL XL LVL3 (GOWN DISPOSABLE) ×6 IMPLANT
IV CATH 14GX2 1/4 (CATHETERS) IMPLANT
KIT BASIN OR (CUSTOM PROCEDURE TRAY) ×3 IMPLANT
KIT POSITIONING SURG ANDREWS (MISCELLANEOUS) ×3 IMPLANT
MANIFOLD NEPTUNE II (INSTRUMENTS) ×3 IMPLANT
NDL SPNL 18GX3.5 QUINCKE PK (NEEDLE) ×2 IMPLANT
NEEDLE SPNL 18GX3.5 QUINCKE PK (NEEDLE) ×6 IMPLANT
PACK LAMINECTOMY ORTHO (CUSTOM PROCEDURE TRAY) ×3 IMPLANT
PATTIES SURGICAL .5 X.5 (GAUZE/BANDAGES/DRESSINGS) IMPLANT
PATTIES SURGICAL .75X.75 (GAUZE/BANDAGES/DRESSINGS) ×3 IMPLANT
RUBBERBAND STERILE (MISCELLANEOUS) ×6 IMPLANT
SPONGE SURGIFOAM ABS GEL 100 (HEMOSTASIS) ×3 IMPLANT
STAPLER VISISTAT (STAPLE) IMPLANT
STRIP CLOSURE SKIN 1/2X4 (GAUZE/BANDAGES/DRESSINGS) ×1 IMPLANT
SUT NURALON 4 0 TR CR/8 (SUTURE) IMPLANT
SUT PROLENE 3 0 PS 2 (SUTURE) IMPLANT
SUT VIC AB 1 CT1 27 (SUTURE)
SUT VIC AB 1 CT1 27XBRD ANTBC (SUTURE) IMPLANT
SUT VIC AB 1-0 CT2 27 (SUTURE) IMPLANT
SUT VIC AB 2-0 CT1 27 (SUTURE)
SUT VIC AB 2-0 CT1 TAPERPNT 27 (SUTURE) IMPLANT
SUT VIC AB 2-0 CT2 27 (SUTURE) IMPLANT
SYR 3ML LL SCALE MARK (SYRINGE) IMPLANT
TOWEL OR 17X26 10 PK STRL BLUE (TOWEL DISPOSABLE) ×3 IMPLANT
YANKAUER SUCT BULB TIP NO VENT (SUCTIONS) ×3 IMPLANT

## 2016-06-20 NOTE — Anesthesia Postprocedure Evaluation (Signed)
Anesthesia Post Note  Patient: Amery Nakamoto  Procedure(s) Performed: Procedure(s) (LRB): MICRO LUMBAR DECOMPRESSION L5-S1 RIGHT DECOMPRESSIVE LUMBAR LAMINECTOMY LEVEL 1 (Right)  Patient location during evaluation: PACU Anesthesia Type: General Level of consciousness: awake and alert Pain management: pain level controlled Vital Signs Assessment: post-procedure vital signs reviewed and stable Respiratory status: spontaneous breathing, nonlabored ventilation, respiratory function stable and patient connected to nasal cannula oxygen Cardiovascular status: blood pressure returned to baseline and stable Postop Assessment: no signs of nausea or vomiting Anesthetic complications: no       Last Vitals:  Vitals:   06/20/16 1530 06/20/16 1540  BP: (!) 142/78 140/78  Pulse: 93   Resp: 19   Temp:  36.9 C    Last Pain:  Vitals:   06/20/16 1515  TempSrc:   PainSc: 7                  Catalyna Reilly,W. EDMOND

## 2016-06-20 NOTE — Anesthesia Preprocedure Evaluation (Addendum)
Anesthesia Evaluation  Patient identified by MRN, date of birth, ID band Patient awake    Reviewed: Allergy & Precautions, H&P , NPO status , Patient's Chart, lab work & pertinent test results  Airway Mallampati: I  TM Distance: >3 FB Neck ROM: Full    Dental no notable dental hx. (+) Teeth Intact, Dental Advisory Given   Pulmonary neg pulmonary ROS,    Pulmonary exam normal breath sounds clear to auscultation       Cardiovascular negative cardio ROS   Rhythm:Regular Rate:Normal     Neuro/Psych negative neurological ROS  negative psych ROS   GI/Hepatic negative GI ROS, Neg liver ROS,   Endo/Other  negative endocrine ROS  Renal/GU negative Renal ROS  negative genitourinary   Musculoskeletal  (+) Arthritis , Osteoarthritis,    Abdominal   Peds  Hematology negative hematology ROS (+)   Anesthesia Other Findings   Reproductive/Obstetrics negative OB ROS                            Anesthesia Physical Anesthesia Plan  ASA: I  Anesthesia Plan: General   Post-op Pain Management:    Induction: Intravenous  Airway Management Planned: Oral ETT  Additional Equipment:   Intra-op Plan:   Post-operative Plan: Extubation in OR  Informed Consent: I have reviewed the patients History and Physical, chart, labs and discussed the procedure including the risks, benefits and alternatives for the proposed anesthesia with the patient or authorized representative who has indicated his/her understanding and acceptance.   Dental advisory given  Plan Discussed with: CRNA  Anesthesia Plan Comments:        Anesthesia Quick Evaluation

## 2016-06-20 NOTE — Interval H&P Note (Signed)
History and Physical Interval Note:  06/20/2016 12:06 PM  Kelly Sharp  has presented today for surgery, with the diagnosis of HNP L5-S1 Right  The various methods of treatment have been discussed with the patient and family. After consideration of risks, benefits and other options for treatment, the patient has consented to  Procedure(s) with comments: MICRO LUMBAR DECOMPRESSION L5-S1 RIGHT DECOMPRESSIVE LUMBAR LAMINECTOMY LEVEL 1 (Right) - Requests 2 hours as a surgical intervention .  The patient's history has been reviewed, patient examined, no change in status, stable for surgery.  I have reviewed the patient's chart and labs.  Questions were answered to the patient's satisfaction.     Jamiere Gulas C

## 2016-06-20 NOTE — Discharge Instructions (Signed)

## 2016-06-20 NOTE — H&P (View-Only) (Signed)
Kelly Sharp is an 40 y.o. female.   Chief Complaint: back and RLE pain HPI: The patient is a 40 year old female who presents today for follow up of their back. The patient is being followed for their low back symptoms. They are now 9 week(s) out from when symptoms began. Symptoms reported today include: pain. Current treatment includes: activity modification, NSAIDs and pain medications. The following medication has been used for pain control: Norco and Advil. The patient presents today following MRI.  Kelly Sharp follows up with an MRI. She does have a moderately large disc herniation L5-S1 to the right, displacing the S1 nerve root.  She has had physical therapy. She has had a relapse. She has been unable to return to work.  Past Medical History:  Diagnosis Date  . Allergy     No past surgical history on file.  Family History  Problem Relation Age of Onset  . Thyroid disease Mother   . Diabetes Father   . Hyperlipidemia Father   . Diabetes Maternal Grandmother   . Diabetes Paternal Grandmother    Social History:  reports that she has never smoked. She has never used smokeless tobacco. She reports that she drinks alcohol. She reports that she does not use drugs.  Allergies: No Known Allergies   (Not in a hospital admission)  No results found for this or any previous visit (from the past 48 hour(s)). No results found.  Review of Systems  Constitutional: Negative.   HENT: Negative.   Eyes: Negative.   Respiratory: Negative.   Cardiovascular: Negative.   Gastrointestinal: Negative.   Genitourinary: Negative.   Musculoskeletal: Positive for back pain.  Skin: Negative.   Neurological: Positive for sensory change and focal weakness.  Psychiatric/Behavioral: Negative.     There were no vitals taken for this visit. Physical Exam  Constitutional: She is oriented to person, place, and time. She appears well-developed and well-nourished. She appears distressed.  HENT:   Head: Normocephalic.  Eyes: Pupils are equal, round, and reactive to light.  Neck: Normal range of motion.  Cardiovascular: Normal rate.   Respiratory: Effort normal.  GI: Soft.  Musculoskeletal:  Healthy. Moderate to severe distress. Mood and affect is appropriate. Her straight leg raises buttock, thigh and calf pain on the right and is positive at 10 degrees, negative on the left. She has trace EHL weakness on plantar flexion and weakness on the right compared to the left, pain with flexion. She can only relieve symptoms in the prone position.  Neurological: She is alert and oriented to person, place, and time.  Skin: Skin is warm and dry.    MRI demonstrates a neurocompressive lesion and disc herniation at 5-1 displacing it to the right, I feel it is moderately large. She has associated disc degeneration.  Assessment/Plan 1. 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 2. 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  We discussed her pathology, relevant anatomy and treatment at this point in time. Given that she is nine weeks status post she has had exacerbation and has a markedly positive neuro tension signs with early myotomal weakness I feel that microlumbar decompression at this point in time would be appropriate option. She is to remain out of work, has not been able to return. I do not feel a corticosteroid injection would be beneficial as there is neural compression at this point. Again she is nine weeks status post I gave  her note to be out of work, discussed lumbar decompression in detail. I had an extensive discussion of the risks and benefits of the lumbar decompression with the patient including bleeding, infection, damage to neurovascular structures, epidural fibrosis, CSF leak requiring repair. We also discussed increase in pain, adjacent segment disease, recurrent disc herniation, need for future  surgery including repeat decompression and/or fusion. We also discussed risks of postoperative hematoma, paralysis, anesthetic complications including DVT, PE, death, cardiopulmonary dysfunction. In addition, the perioperative and postoperative courses were discussed in detail including the rehabilitative time and return to functional activity and work. I provided the patient with an illustrated handout and utilized the appropriate surgical models. We will keep a note to be out of work. She is to avoid stretching of the nerve. At work when she returns, we discussed appropriate ergonomic, standing desk, etc. Any change in the interim, she is to call or her husband call in as well and we spoke with him. Her son was here as well. Again, she is fairly miserable and has had nine weeks of persistent symptoms despite physical therapy, activity modification, home exercise program, etc.  Plan microlumbar decompression L5-S1 right  Cecilie Kicks., PA-C  For Dr. Tonita Cong 06/06/2016, 1:26 PM

## 2016-06-20 NOTE — Transfer of Care (Signed)
Immediate Anesthesia Transfer of Care Note  Patient: Kelly Sharp  Procedure(s) Performed: Procedure(s) with comments: MICRO LUMBAR DECOMPRESSION L5-S1 RIGHT DECOMPRESSIVE LUMBAR LAMINECTOMY LEVEL 1 (Right) - Requests 2 hours  Patient Location: PACU  Anesthesia Type:General  Level of Consciousness: sedated  Airway & Oxygen Therapy: Patient Spontanous Breathing and Patient connected to face mask oxygen  Post-op Assessment: Report given to RN and Post -op Vital signs reviewed and stable  Post vital signs: Reviewed and stable  Last Vitals:  Vitals:   06/20/16 1110  BP: (!) 168/86  Pulse: (!) 112  Resp: 18  Temp: 37 C    Last Pain:  Vitals:   06/20/16 1126  TempSrc:   PainSc: 5       Patients Stated Pain Goal: 4 ([id number])  Complications: No apparent anesthesia complications

## 2016-06-20 NOTE — Brief Op Note (Signed)
06/20/2016  2:00 PM  PATIENT:  Kelly Sharp  40 y.o. female  PRE-OPERATIVE DIAGNOSIS:  HNP L5-S1 Right  POST-OPERATIVE DIAGNOSIS:  HNP L5-S1 Right  PROCEDURE:  Procedure(s) with comments: MICRO LUMBAR DECOMPRESSION L5-S1 RIGHT DECOMPRESSIVE LUMBAR LAMINECTOMY LEVEL 1 (Right) - Requests 2 hours  SURGEON:  Surgeon(s) and Role:    * Susa Day, MD - Primary  PHYSICIAN ASSISTANT:   ASSISTANTS: Bissell   ANESTHESIA:   general  EBL:  No intake/output data recorded.  BLOOD ADMINISTERED:none  DRAINS: none   LOCAL MEDICATIONS USED:  LIDOCAINE   SPECIMEN:  Source of Specimen:  L5S1  DISPOSITION OF SPECIMEN:  PATHOLOGY  COUNTS:  YES  TOURNIQUET:  * No tourniquets in log *  DICTATION: .Other Dictation: Dictation Number [id number]  PLAN OF CARE: Admit for overnight observation  PATIENT DISPOSITION:  PACU - hemodynamically stable.   Delay start of Pharmacological VTE agent (>24hrs) due to surgical blood loss or risk of bleeding: yes

## 2016-06-20 NOTE — Anesthesia Procedure Notes (Signed)
Procedure Name: Intubation Date/Time: 06/20/2016 12:42 PM Performed by: Lind Covert Pre-anesthesia Checklist: Patient identified, Timeout performed, Emergency Drugs available, Patient being monitored and Suction available Patient Re-evaluated:Patient Re-evaluated prior to inductionOxygen Delivery Method: Circle system utilized Preoxygenation: Pre-oxygenation with 100% oxygen Intubation Type: IV induction Laryngoscope Size: Mac and 4 Grade View: Grade I Tube type: Oral Tube size: 7.0 mm Number of attempts: 1 Airway Equipment and Method: Stylet Placement Confirmation: ETT inserted through vocal cords under direct vision,  positive ETCO2 and breath sounds checked- equal and bilateral Secured at: 22 cm Tube secured with: Tape Dental Injury: Teeth and Oropharynx as per pre-operative assessment

## 2016-06-21 DIAGNOSIS — M5117 Intervertebral disc disorders with radiculopathy, lumbosacral region: Secondary | ICD-10-CM | POA: Diagnosis not present

## 2016-06-21 MED ORDER — ADULT MULTIVITAMIN W/MINERALS CH: 2.0000 | ORAL_TABLET | Freq: Every day | ORAL | Status: DC

## 2016-06-21 MED ORDER — HYDROCODONE-ACETAMINOPHEN 5-325 MG PO TABS: 1.0000 | ORAL_TABLET | ORAL | 0 refills | Status: DC | PRN

## 2016-06-21 NOTE — Progress Notes (Signed)
Spoke with Ronette Deter, PA for Dr. Tonita Cong to request additional medication for nausea/vomiting. VORB for Phenergan 6.25-12.5mg  every 4 hours prn for nausea/vomiting, see orders.

## 2016-06-21 NOTE — Evaluation (Signed)
Physical Therapy Evaluation Patient Details Name: Anastazja Stinnett MRN: [id number] DOB: 1976-04-23 Today's Date: 06/21/2016   History of Present Illness  MICRO LUMBAR DECOMPRESSION L5-S1 RIGHT DECOMPRESSIVE LUMBAR LAMINECTOMY LEVEL 1 (Right)  Clinical Impression  The patient expresses concern for DC today due to  N/V overnight. Pt admitted with above diagnosis. Pt currently with functional limitations due to the deficits listed below (see PT Problem List). Pt will benefit from skilled PT to increase their independence and safety with mobility to allow discharge to the venue listed below.       Follow Up Recommendations No PT follow up    Equipment Recommendations  Rolling walker with 5" wheels    Recommendations for Other Services       Precautions / Restrictions Precautions Precautions: Back Precaution Booklet Issued: Yes (comment) Precaution Comments: reviewed back precautions with pt and husband Required Braces or Orthoses:  (no back brace) Restrictions Weight Bearing Restrictions: No      Mobility  Bed Mobility Overal bed mobility: Needs Assistance Bed Mobility: Sit to Sidelying Rolling: Min assist Sidelying to sit: Min assist     Sit to sidelying: Supervision General bed mobility comments: pt and her husband educated on log roll , min A for sequencing/technique  Transfers Overall transfer level: Needs assistance Equipment used: Rolling walker (2 wheeled) Transfers: Sit to/from Stand Sit to Stand: Min guard         General transfer comment: cues for correct hand placement  Ambulation/Gait Ambulation/Gait assistance: Supervision Ambulation Distance (Feet): 100 Feet Assistive device: Rolling walker (2 wheeled) Gait Pattern/deviations: Step-through pattern     General Gait Details: cues for posture, relies on the arms  Stairs            Wheelchair Mobility    Modified Rankin (Stroke Patients Only)       Balance Overall balance assessment:  No apparent balance deficits (not formally assessed)                                           Pertinent Vitals/Pain Pain Assessment: 0-10 Pain Score: 6  Pain Location: back Pain Descriptors / Indicators: Aching Pain Intervention(s): Monitored during session;Premedicated before session    Home Living Family/patient expects to be discharged to:: Private residence Living Arrangements: Spouse/significant other Available Help at Discharge: Family Type of Home: House Home Access: Stairs to enter Entrance Stairs-Rails: None Technical brewer of Steps: 4 Home Layout: One level Home Equipment: None      Prior Function Level of Independence: Independent               Hand Dominance   Dominant Hand: Right    Extremity/Trunk Assessment   Upper Extremity Assessment Upper Extremity Assessment: Defer to OT evaluation    Lower Extremity Assessment Lower Extremity Assessment: Overall WFL for tasks assessed    Cervical / Trunk Assessment Cervical / Trunk Assessment: Normal  Communication   Communication: No difficulties  Cognition Arousal/Alertness: Awake/alert Behavior During Therapy: WFL for tasks assessed/performed Overall Cognitive Status: Within Functional Limits for tasks assessed                      General Comments      Exercises     Assessment/Plan    PT Assessment Patient needs continued PT services  PT Problem List Decreased mobility;Pain;Decreased knowledge of precautions  PT Treatment Interventions Gait training;Stair training    PT Goals (Current goals can be found in the Care Plan section)  Acute Rehab PT Goals Patient Stated Goal: go home PT Goal Formulation: With patient/family Time For Goal Achievement: 06/22/16 Potential to Achieve Goals: Good    Frequency Min 5X/week   Barriers to discharge        Co-evaluation               End of Session   Activity Tolerance: Patient tolerated  treatment well Patient left: in bed;with call bell/phone within reach;with family/visitor present Nurse Communication: Mobility status    Functional Assessment Tool Used: clinical judgement Functional Limitation: Mobility: Walking and moving around Mobility: Walking and Moving Around Current Status [id number]): At least 20 percent but less than 40 percent impaired, limited or restricted Mobility: Walking and Moving Around Goal Status [phone]): At least 1 percent but less than 20 percent impaired, limited or restricted    Time: 1048-1106 PT Time Calculation (min) (ACUTE ONLY): 18 min   Charges:         PT G Codes:   PT G-Codes **NOT FOR INPATIENT CLASS** Functional Assessment Tool Used: clinical judgement Functional Limitation: Mobility: Walking and moving around Mobility: Walking and Moving Around Current Status [id number]): At least 20 percent but less than 40 percent impaired, limited or restricted Mobility: Walking and Moving Around Goal Status [phone]): At least 1 percent but less than 20 percent impaired, limited or restricted    [name] 06/21/2016, 12:45 PM

## 2016-06-21 NOTE — Evaluation (Signed)
Occupational Therapy Evaluation Patient Details Name: Kelly Sharp MRN: [id number] DOB: 07-Oct-1975 Today's Date: 06/21/2016    History of Present Illness MICRO LUMBAR DECOMPRESSION L5-S1 RIGHT DECOMPRESSIVE LUMBAR LAMINECTOMY LEVEL 1 (Right)   Clinical Impression   Pt with decline in function and safety with ADLs and ADL mobility with decreased endurance and pain. Pt would benefit from acute OT services to address impairments to increase level of function and safety    Follow Up Recommendations  No OT follow up;Supervision - Intermittent    Equipment Recommendations  3 in 1 bedside commode;Other (comment);Tub/shower seat (ADL A/E kit, toileting aid)    Recommendations for Other Services PT consult     Precautions / Restrictions Precautions Precautions: Back Precaution Booklet Issued: Yes (comment) Precaution Comments: reviewed back precautions with pt and husband Required Braces or Orthoses:  (no back brace) Restrictions Weight Bearing Restrictions: No      Mobility Bed Mobility Overal bed mobility: Needs Assistance Bed Mobility: Rolling;Sidelying to Sit Rolling: Min assist Sidelying to sit: Min assist       General bed mobility comments: pt and her husband educated on log roll , min A for sequencing/technique  Transfers Overall transfer level: Needs assistance Equipment used: Rolling walker (2 wheeled) Transfers: Sit to/from Stand Sit to Stand: Min guard         General transfer comment: cues for correct hand placement    Balance Overall balance assessment: No apparent balance deficits (not formally assessed)                                          ADL Overall ADL's : Needs assistance/impaired     Grooming: Wash/dry hands;Wash/dry face;Standing;Min guard   Upper Body Bathing: Set up;Supervision/ safety;With caregiver independent assisting   Lower Body Bathing: Minimal assistance;With caregiver independent assisting   Upper  Body Dressing : Supervision/safety;Set up;With caregiver independent assisting   Lower Body Dressing: With caregiver independent assisting;Minimal assistance   Toilet Transfer: Min guard;RW;Ambulation;Comfort height toilet   Toileting- Clothing Manipulation and Hygiene: Min guard;Sit to/from stand   Tub/ Shower Transfer: Min guard;Grab bars;Rolling walker;Ambulation   Functional mobility during ADLs: Min guard General ADL Comments: educated pt and her husband on ADL A/E and toileting aids for home use     Vision Vision Assessment?: No apparent visual deficits              Pertinent Vitals/Pain Pain Assessment: 0-10 Pain Score: 6  Pain Location: back Pain Descriptors / Indicators: Aching;Sore Pain Intervention(s): Limited activity within patient's tolerance;Monitored during session;Premedicated before session;Repositioned     Hand Dominance Right   Extremity/Trunk Assessment Upper Extremity Assessment Upper Extremity Assessment: Overall WFL for tasks assessed   Lower Extremity Assessment Lower Extremity Assessment: Defer to PT evaluation   Cervical / Trunk Assessment Cervical / Trunk Assessment: Normal   Communication Communication Communication: No difficulties   Cognition Arousal/Alertness: Awake/alert Behavior During Therapy: WFL for tasks assessed/performed Overall Cognitive Status: Within Functional Limits for tasks assessed                     General Comments   pt very pleasant and cooperative, husband very supportive                 Home Living Family/patient expects to be discharged to:: Private residence Living Arrangements: Spouse/significant other Available Help at Discharge: Family Type of Home: House  Home Access: Stairs to enter Entrance Stairs-Number of Steps: 4 Entrance Stairs-Rails: None Home Layout: One level     Bathroom Shower/Tub: Occupational psychologist: Handicapped height Bathroom Accessibility: Yes   Home  Equipment: None          Prior Functioning/Environment Level of Independence: Independent                 OT Problem List: Pain;Decreased activity tolerance;Decreased knowledge of use of DME or AE   OT Treatment/Interventions: Self-care/ADL training;DME and/or AE instruction;Therapeutic activities;Patient/family education    OT Goals(Current goals can be found in the care plan section) Acute Rehab OT Goals Patient Stated Goal: go home OT Goal Formulation: With patient/family Time For Goal Achievement: 06/28/16 Potential to Achieve Goals: Good ADL Goals Pt Will Perform Grooming: with set-up;with supervision;standing Pt Will Perform Lower Body Bathing: with min guard assist;with supervision;with set-up;with caregiver independent in assisting Pt Will Perform Lower Body Dressing: with min guard assist;with supervision;with set-up;with caregiver independent in assisting Pt Will Transfer to Toilet: with supervision;ambulating Pt Will Perform Toileting - Clothing Manipulation and hygiene: with supervision;sit to/from stand;with caregiver independent in assisting Pt Will Perform Tub/Shower Transfer: with caregiver independent in assisting;with supervision;ambulating;shower seat;3 in 1  OT Frequency: Min 2X/week   Barriers to D/C:    no barriers                     End of Session Equipment Utilized During Treatment: Rolling walker;Other (comment) (3 in 1)  Activity Tolerance: Patient tolerated treatment well Patient left: in chair;with call bell/phone within reach;with family/visitor present   Time: 2787-1836 OT Time Calculation (min): 33 min Charges:  OT General Charges $OT Visit: 1 Procedure OT Evaluation $OT Eval Moderate Complexity: 1 Procedure OT Treatments $Self Care/Home Management : 8-22 mins $Therapeutic Activity: 8-22 mins G-Codes: OT G-codes **NOT FOR INPATIENT CLASS** Functional Assessment Tool Used: clinical judgement Functional Limitation: Self  care Self Care Current Status (D2550): At least 1 percent but less than 20 percent impaired, limited or restricted Self Care Goal Status (I1642): At least 1 percent but less than 20 percent impaired, limited or restricted  Britt Bottom 06/21/2016, 10:37 AM

## 2016-06-21 NOTE — Progress Notes (Signed)
Physical Therapy Treatment Patient Details Name: Kelly Sharp MRN: [id number] DOB: 30-Aug-1975 Today's Date: 06/21/2016    History of Present Illness MICRO LUMBAR DECOMPRESSION L5-S1 RIGHT DECOMPRESSIVE LUMBAR LAMINECTOMY LEVEL 1 (Right)    PT Comments    The  Patient is progressing. Encouraged patient to ambulate tonight with family.  Follow Up Recommendations  No PT follow up     Equipment Recommendations  Rolling walker with 5" wheels    Recommendations for Other Services       Precautions / Restrictions Precautions Precautions: Back Precaution Comments: reviewed back precautions with pt and husband    Mobility  Bed Mobility   Bed Mobility: Sidelying to Sit   Sidelying to sit: Supervision     Sit to sidelying: Supervision General bed mobility comments: pt and her husband educated on log roll ,  Cues for sequencing/technique  Transfers Overall transfer level: Needs assistance Equipment used: Rolling walker (2 wheeled) Transfers: Sit to/from Stand Sit to Stand: Supervision         General transfer comment: cues for correct hand placement  Ambulation/Gait Ambulation/Gait assistance: Supervision Ambulation Distance (Feet): 150 Feet Assistive device: Rolling walker (2 wheeled) Gait Pattern/deviations: Step-through pattern     General Gait Details: cues for posture, relies on the arms   Stairs            Wheelchair Mobility    Modified Rankin (Stroke Patients Only)       Balance                                    Cognition Arousal/Alertness: Awake/alert                          Exercises      General Comments        Pertinent Vitals/Pain Pain Score: 5  Pain Location: back Pain Descriptors / Indicators: Aching;Sore Pain Intervention(s): Monitored during session;Premedicated before session;Repositioned    Home Living                      Prior Function            PT Goals (current goals  can now be found in the care plan section) Acute Rehab PT Goals Patient Stated Goal: go home PT Goal Formulation: With patient/family Time For Goal Achievement: 06/22/16 Potential to Achieve Goals: Good Progress towards PT goals: Progressing toward goals    Frequency    Min 5X/week      PT Plan Current plan remains appropriate    Co-evaluation             End of Session   Activity Tolerance: Patient tolerated treatment well Patient left:  (in room with spouse assisting)     Time: [id number] PT Time Calculation (min) (ACUTE ONLY): 15 min  Charges:  $Gait Training: 8-22 mins                    G Codes:  Functional Assessment Tool Used: clinical judgement Functional Limitation: Mobility: Walking and moving around Mobility: Walking and Moving Around Current Status [phone]): At least 20 percent but less than 40 percent impaired, limited or restricted Mobility: Walking and Moving Around Goal Status [phone]): At least 1 percent but less than 20 percent impaired, limited or restricted   [name] 06/21/2016, 4:31 PM

## 2016-06-21 NOTE — Progress Notes (Signed)
Subjective: 1 Day Post-Op Procedure(s) (LRB): MICRO LUMBAR DECOMPRESSION L5-S1 RIGHT DECOMPRESSIVE LUMBAR LAMINECTOMY LEVEL 1 (Right) Patient reports pain as mild.  Reports she is feeling better this morning. Leg pain improved. Nausea from yesterday improved. No other c/o. Seen by myself and Dr. Tonita Cong.  Objective: Vital signs in last 24 hours: Temp:  [98.1 F (36.7 C)-99.1 F (37.3 C)] 98.2 F (36.8 C) (12/28 0550) Pulse Rate:  [71-112] 82 (12/28 0550) Resp:  [12-19] 18 (12/28 0550) BP: (104-168)/(49-86) 104/49 (12/28 0550) SpO2:  [98 %-100 %] 100 % (12/28 0550) Weight:  [77.6 kg (171 lb)] 77.6 kg (171 lb) (12/27 1129)  Intake/Output from previous day: 12/27 0701 - 12/28 0700 In: 2360 [P.O.:360; I.V.:1800; IV Piggyback:200] Out: 470 [Urine:450; Blood:20] Intake/Output this shift: No intake/output data recorded.  No results for input(s): HGB in the last 72 hours. No results for input(s): WBC, RBC, HCT, PLT in the last 72 hours. No results for input(s): NA, K, CL, CO2, BUN, CREATININE, GLUCOSE, CALCIUM in the last 72 hours. No results for input(s): LABPT, INR in the last 72 hours.  Neurologically intact ABD soft Neurovascular intact Sensation intact distally Intact pulses distally Dorsiflexion/Plantar flexion intact Incision: dressing C/D/I and no drainage No cellulitis present Compartment soft no sign of DVT  Assessment/Plan: 1 Day Post-Op Procedure(s) (LRB): MICRO LUMBAR DECOMPRESSION L5-S1 RIGHT DECOMPRESSIVE LUMBAR LAMINECTOMY LEVEL 1 (Right) Advance diet Up with therapy D/C IV fluids  Discussed dressing instructions, LSpine precautions, D/C instructions D/C home later today after PT Follow up in office in 10-14 days   Kelly Armistead M. 06/21/2016, 8:48 AM

## 2016-06-21 NOTE — Care Management Note (Signed)
Case Management Note  Patient Details  Name: Kelly Sharp MRN: [id number] Date of Birth: 1975/12/11  Subjective/Objective:                  MICRO LUMBAR DECOMPRESSION L5-S1 RIGHT DECOMPRESSIVE LUMBAR LAMINECTOMY LEVEL 1 (Right) Action/Plan: Discharge planning Expected Discharge Date:                  Expected Discharge Plan:  Home/Self Care  In-House Referral:     Discharge planning Services  CM Consult  Post Acute Care Choice:  Durable Medical Equipment Choice offered to:  Patient  DME Arranged:  3-N-1, Walker rolling DME Agency:  [place]:  NA [place] Agency:  NA  Status of Service:  Completed, signed off  If discussed at [place] of Stay Meetings, dates discussed:    Additional Comments: CM met with pt in room to discuss DMe needs.  Cm notified [place] DME rep, Larene Beach to please deliver the rolling walker and 3n1 to room prior to discharge.  No other CM needs were communicated. Dellie Catholic, RN 06/21/2016, 11:11 AM

## 2016-06-21 NOTE — Discharge Summary (Addendum)
Physician Discharge Summary   Patient ID: Kelly Sharp MRN: [id number] DOB/AGE: [birth year] 38 y.o.  Admit date: 06/20/2016 Discharge date: 06/22/2016  Primary Diagnosis:   HNP L5-S1 Right  Admission Diagnoses:  Past Medical History:  Diagnosis Date  . Allergy   . Arthritis    L knee  . Conjunctivitis    reported by patient at preop on 06/14/16  patient reports approx 05/30/2016   . Mitral valve prolapse    ? as a teenager   Discharge Diagnoses:   Principal Problem:   HNP (herniated nucleus pulposus), lumbar Active Problems:   Spinal stenosis of lumbar region  Procedure:  Procedure(s) (LRB): MICRO LUMBAR DECOMPRESSION L5-S1 RIGHT DECOMPRESSIVE LUMBAR LAMINECTOMY LEVEL 1 (Right)   Consults: None  HPI:  see H&P    Laboratory Data: Hospital Outpatient Visit on 06/14/2016  Component Date Value Ref Range Status  . Sodium 06/14/2016 137  135 - 145 mmol/L Final  . Potassium 06/14/2016 4.1  3.5 - 5.1 mmol/L Final  . Chloride 06/14/2016 101  101 - 111 mmol/L Final  . CO2 06/14/2016 29  22 - 32 mmol/L Final  . Glucose, Bld 06/14/2016 99  65 - 99 mg/dL Final  . BUN 06/14/2016 11  6 - 20 mg/dL Final  . Creatinine, Ser 06/14/2016 0.77  0.44 - 1.00 mg/dL Final  . Calcium 06/14/2016 9.1  8.9 - 10.3 mg/dL Final  . GFR calc non Af Amer 06/14/2016 >60  >60 mL/min Final  . GFR calc Af Amer 06/14/2016 >60  >60 mL/min Final   Comment: (NOTE) The eGFR has been calculated using the CKD EPI equation. This calculation has not been validated in all clinical situations. eGFR's persistently <60 mL/min signify possible Chronic Kidney Disease.   . Anion gap 06/14/2016 7  5 - 15 Final  . WBC 06/14/2016 5.9  4.0 - 10.5 K/uL Final  . RBC 06/14/2016 4.45  3.87 - 5.11 MIL/uL Final  . Hemoglobin 06/14/2016 12.2  12.0 - 15.0 g/dL Final  . HCT 06/14/2016 38.2  36.0 - 46.0 % Final  . MCV 06/14/2016 85.8  78.0 - 100.0 fL Final  . MCH 06/14/2016 27.4  26.0 - 34.0 pg Final  . MCHC 06/14/2016  31.9  30.0 - 36.0 g/dL Final  . RDW 06/14/2016 13.0  11.5 - 15.5 % Final  . Platelets 06/14/2016 385  150 - 400 K/uL Final  . ABO/RH(D) 06/20/2016 A POS   Final  . Antibody Screen 06/20/2016 NEG   Final  . Sample Expiration 06/20/2016 06/23/2016   Final  . Extend sample reason 06/20/2016 NO TRANSFUSIONS OR PREGNANCY IN THE PAST 3 MONTHS   Final  . MRSA, PCR 06/14/2016 NEGATIVE  NEGATIVE Final  . Staphylococcus aureus 06/14/2016 NEGATIVE  NEGATIVE Final   Comment:        The Xpert SA Assay (FDA approved for NASAL specimens in patients over 80 years of age), is one component of a comprehensive surveillance program.  Test performance has been validated by [facility] for patients greater than or equal to 54 year old. It is not intended to diagnose infection nor to guide or monitor treatment.   . Preg Test, Ur 06/14/2016 NEGATIVE  NEGATIVE Final   Comment:        THE SENSITIVITY OF THIS METHODOLOGY IS >20 mIU/mL.   . ABO/RH(D) 06/14/2016 A POS   Final   No results for input(s): HGB in the last 72 hours. No results for input(s): WBC, RBC, HCT, PLT in the last  72 hours. No results for input(s): NA, K, CL, CO2, BUN, CREATININE, GLUCOSE, CALCIUM in the last 72 hours. No results for input(s): LABPT, INR in the last 72 hours.  X-Rays:Dg Lumbar Spine 2-3 Views  Result Date: 06/14/2016 CLINICAL DATA:  Lumbar disc herniation. EXAM: LUMBAR SPINE - 2-3 VIEW COMPARISON:  05/31/2012 FINDINGS: There is no evidence of lumbar spine fracture. Alignment is normal. Intervertebral disc spaces are maintained. No focal bone lesions identified. IMPRESSION: Negative lumbar spine radiographs. Electronically Signed   By: Earle Gell M.D.   On: 06/14/2016 13:20   Dg Spine Portable 1 View  Result Date: 06/20/2016 CLINICAL DATA:  Intraoperative localization for spine surgery. EXAM: PORTABLE SPINE - 1 VIEW COMPARISON:  Lumbar spine radiographs 06/14/2016 FINDINGS: Lateral lumbar spine film labeled 3  demonstrates a surgical instrument at the L5-S1 disc space. IMPRESSION: L5-S1 marked intraoperatively. Electronically Signed   By: Marijo Sanes M.D.   On: 06/20/2016 13:59   Dg Spine Portable 1 View  Result Date: 06/20/2016 CLINICAL DATA:  Portable lateral lumbar spine imaging for surgical localization. EXAM: PORTABLE SPINE - 1 VIEW COMPARISON:  Earlier localization imaging, 06/20/2016 at 12:49 p.m. FINDINGS: Two surgical probes of been inserted. The more superior has its tip projecting 2 cm posterior to the posterior margin of the L5-S1 disc. The more inferior has its tip projecting 13 mm posterior to the posterior lower margin of S1. IMPRESSION: Surgical localization imaging as described. Electronically Signed   By: Lajean Manes M.D.   On: 06/20/2016 13:23   Dg Spine Portable 1 View  Result Date: 06/20/2016 CLINICAL DATA:  Portable lateral lumbar spine imaging for surgical localization. EXAM: PORTABLE SPINE - 1 VIEW COMPARISON:  06/14/2016 FINDINGS: Two surgical probes are needles have been inserted posteriorly. The more superior has its tip lying just posterior to the posterior lower aspect of the L4 spinous process, 5.9 cm posterior to the posterior upper aspect of the L5 vertebra. The lower probe or needle projects over the L5 spinous process, 4 cm posterior to the posterior upper endplate of S1. IMPRESSION: Surgical localization imaging as described. Electronically Signed   By: Lajean Manes M.D.   On: 06/20/2016 13:08    EKG:No orders found for this or any previous visit.   Hospital Course: Patient was admitted to [facility] and taken to the OR and underwent the above state procedure without complications.  Patient tolerated the procedure well and was later transferred to the recovery room and then to the orthopaedic floor for postoperative care.  They were given PO and IV analgesics for pain control following their surgery.  They were given 24 hours of postoperative antibiotics.    PT was consulted postop to assist with mobility and transfers.  The patient was allowed to be WBAT with therapy and was taught back precautions. Discharge planning was consulted to help with postop disposition and equipment needs.  Patient had a good night on the evening of surgery and started to get up OOB with therapy on day one. Patient was seen in rounds and was ready to go home on day two, suboptimal pain control on day one held up discharge.  They were given discharge instructions and dressing directions.  They were instructed on when to follow up in the office with Dr. Tonita Cong.   Diet: Regular diet Activity:WBAT; Lspine precautions Follow-up:in 10-14 days Disposition - Home Discharged Condition: good   Discharge Instructions    Call MD / Call 911    Complete by:  As directed    If you experience chest pain or shortness of breath, CALL 911 and be transported to the hospital emergency room.  If you develope a fever above 101 F, pus (white drainage) or increased drainage or redness at the wound, or calf pain, call your surgeon's office.   Constipation Prevention    Complete by:  As directed    Drink plenty of fluids.  Prune juice may be helpful.  You may use a stool softener, such as Colace (over the counter) 100 mg twice a day.  Use MiraLax (over the counter) for constipation as needed.   Diet - low sodium heart healthy    Complete by:  As directed    Increase activity slowly as tolerated    Complete by:  As directed      Allergies as of 06/21/2016   No Known Allergies     Medication List    STOP taking these medications   NUTRITIONAL SUPPLEMENT PO   OVER THE COUNTER MEDICATION     TAKE these medications   docusate sodium 100 MG capsule Commonly known as:  COLACE Take 1 capsule (100 mg total) by mouth 2 (two) times daily as needed for mild constipation.   HYDROcodone-acetaminophen 5-325 MG tablet Commonly known as:  NORCO/VICODIN Take 1-2 tablets by mouth every 4 (four)  hours as needed. What changed:  how much to take  when to take this  reasons to take this   methocarbamol 500 MG tablet Commonly known as:  ROBAXIN Take 1 tablet (500 mg total) by mouth every 6 (six) hours as needed for muscle spasms. What changed:  when to take this  reasons to take this   multivitamin with minerals Tabs tablet Take 2 tablets by mouth at bedtime. May resume 5 days post-op - X Factor Plus What changed:  additional instructions   polyethylene glycol packet Commonly known as:  MIRALAX / GLYCOLAX Take 17 g by mouth daily.   PROBIOTIC PO Take 2 capsules by mouth at bedtime. Plexus ProBio 5   TRI-PREVIFEM 0.18/0.215/0.25 MG-35 MCG tablet Generic drug:  Norgestimate-Ethinyl Estradiol Triphasic Take 1 tablet by mouth every evening.   trimethoprim-polymyxin b ophthalmic solution Commonly known as:  POLYTRIM Place 1 drop into both eyes 2 (two) times daily.      Follow-up Information    BEANE,JEFFREY C, MD Follow up in 2 week(s).   Specialty:  Orthopedic Surgery Contact information: [address] [place] [address] [phone]           Signed: Lacie Draft, PA-C Orthopaedic Surgery 06/21/2016, 8:49 AM

## 2016-06-21 NOTE — Op Note (Signed)
NAME:  Mackie, LATISHA                  ACCOUNT NO.:  MEDICAL RECORD NO.:  LOCATION:                                 FACILITY:  PHYSICIAN:  Susa Day, M.D.         DATE OF BIRTH:  DATE OF PROCEDURE:  06/20/2016 DATE OF DISCHARGE:                              OPERATIVE REPORT   PREOPERATIVE DIAGNOSIS:  Spinal stenosis, herniated nucleus pulposus at L5-S1, right.  POSTOPERATIVE DIAGNOSIS:  Spinal stenosis, herniated nucleus pulposus at L5-S1, right.  PROCEDURES PERFORMED: 1. Microlumbar decompression, L5-S1, right. 2. Foraminotomies, L5-S1, right. 3. Microdiskectomy, 5-1, right.  ANESTHESIA:  General.  ASSISTANT:  Cleophas Dunker, PA.  SPECIMEN:  Disk herniation to Pathology.  HISTORY:  A 40, S1 radiculopathy, L5, secondary to disk herniation, compressing the L5-S1 nerve roots, had myotomal weakness, dermatomal dysesthesias.  MRI indicating disk herniation compressing those nerve roots refractory to conservative treatment and indicated for decompression of the L5-S1 nerve roots.  Risk and benefits discussed including bleeding, infection, damage to neurovascular structures, no change in symptoms, worsening symptoms, DVT, PE, anesthetic complications, etc.  TECHNIQUE:  With the patient in supine position, after induction of adequate general anesthesia, 2 g of Kefzol, placed prone on the [place] frame.  All bony prominences were well padded.  Lumbar region was prepped and draped in usual sterile fashion.  Two 18-gauge spinal needles were utilized to localize the 5-1 interspace confirmed with x- ray.  Incision was made from the spinous process of 5 to S1. Subcutaneous tissue was dissected, electrocautery utilized to achieve hemostasis.  Dorsolumbar fascia was divided in line with skin incision. Paraspinous muscle elevated from lamina of 5-1.  Operating microscope was draped and brought on the surgical field.  The patient had increased lumbosacral angle with  shingling of the L5 lamina.  Fairly large interlaminar window at 5-1 extending down to the sacrum.  Slight enlargement and incision for adequate exposure for visualization. Confirmatory radiograph obtained.  A microcurette was utilized to detach the ligamentum flavum from the cephalad edge of S1.  Neuro patty placed beneath the ligamentum flavum.  Hemilaminotomy of the caudad edge of 5 was then performed.  Ligamentum flavum was removed from the interspace after neuro patty placed beneath that.  With the neural elements were well protected, a generous S1 foraminotomy was performed.  Identified the S1 nerve root, gently mobilized it medially.  It was compressed in the lateral recess secondary to facet hypertrophy and disk herniation. Large disk herniation was noted.  We decompressed the lateral recess to the medial border of the pedicle, performing a foraminotomy of 5. Bipolar electrocautery was utilized to achieve hemostasis.  Again, a focal HNP was noted and annulotomy performed.  Copious portion of the disk material was removed from the disk space straight with micropituitary, further mobilized with an Epstein, preserving the endplates.  Lavaged with catheter antibiotic lavage, additional fragments retrieved, confirmatory radiograph obtained.  Following this, there was no disk herniation noted beneath the thecal sac, the axilla, the shoulder of the root out into the foramen of 5 or S1.  There was 1 cm of excursion of the S1 nerve root via the pedicle without tension.  No evidence of CSF leakage or active bleeding.  We draped the epidural fat over the S1 nerve root.  We removed the [facility] retractor, irrigated the paraspinous musculature.  No active bleeding.  We closed the dorsal fascia with 1 Vicryl, subcu with 2-0 and skin with Prolene. Sterile dressing applied.  Placed supine on the hospital bed, extubated without difficulty, and transported to the recovery room in  satisfactory condition.  The patient tolerated the procedure well.  No complications.  Assistant, Cleophas Dunker, [place].  Minimal blood loss.  Cleophas Dunker, PA was used throughout the case for patient positioning, gentle intermittent neural traction, suction, and closure.     Susa Day, M.D.     Geralynn Rile  D:  06/20/2016  T:  06/21/2016  Job:  [id number]

## 2016-06-22 DIAGNOSIS — M5117 Intervertebral disc disorders with radiculopathy, lumbosacral region: Secondary | ICD-10-CM | POA: Diagnosis not present

## 2016-06-22 MED ORDER — OXYCODONE-ACETAMINOPHEN 5-325 MG PO TABS: 1.0000 | ORAL_TABLET | ORAL | 0 refills | Status: DC | PRN

## 2016-06-22 NOTE — Progress Notes (Signed)
Occupational Therapy Treatment Patient Details Name: Kelly Sharp MRN: [id number] DOB: [birth year] Today's Date: 06/22/2016    History of present illness MICRO LUMBAR DECOMPRESSION L5-S1 RIGHT DECOMPRESSIVE LUMBAR LAMINECTOMY LEVEL 1 (Right)   OT comments  Pt making good progress with functional goals and scheduled to d/c home today with family assist  Follow Up Recommendations  No OT follow up;Supervision - Intermittent    Equipment Recommendations  3 in 1 bedside commode;Other (comment);Tub/shower seat (ADL A/E)    Recommendations for Other Services      Precautions / Restrictions Precautions Precautions: Back Precaution Comments: reviewed back precautions with pt and husband Restrictions Weight Bearing Restrictions: No       Mobility Bed Mobility Overal bed mobility: Needs Assistance Bed Mobility: Sidelying to Sit;Sit to Sidelying   Sidelying to sit: Supervision     Sit to sidelying: Supervision    Transfers Overall transfer level: Needs assistance Equipment used: Rolling walker (2 wheeled) Transfers: Sit to/from Stand Sit to Stand: Supervision              Balance Overall balance assessment: No apparent balance deficits (not formally assessed)                                 ADL       Grooming: Wash/dry hands;Wash/dry face;Standing;Supervision/safety       Lower Body Bathing: Minimal assistance;With caregiver independent assisting           Toilet Transfer: Supervision/safety;Comfort height toilet;RW;Ambulation   Toileting- Clothing Manipulation and Hygiene: Supervision/safety   Tub/ Shower Transfer: Min guard;Grab bars;Rolling walker;Ambulation;Supervision/safety     General ADL Comments: pt reports that she showered eralier this morning with husband assisting. Pt alos reports that she plans to get sock aid, reacher and toileitng aid on the way home                                      Cognition    Behavior During Therapy: [facility] for tasks assessed/performed Overall Cognitive Status: Within Functional Limits for tasks assessed                       Extremity/Trunk Assessment   WFL                        General Comments  pt very pleasant and cooperative    Pertinent Vitals/ Pain       Pain Assessment: 0-10 Pain Score: 4  Pain Location: back Pain Descriptors / Indicators: Aching;Sore Pain Intervention(s): Premedicated before session;Monitored during session;Repositioned   Frequency  Min 2X/week        Progress Toward Goals  OT Goals(current goals can now be found in the care plan section)  Progress towards OT goals: Progressing toward goals  Acute Rehab OT Goals Patient Stated Goal: go home  Plan Discharge plan remains appropriate                     End of Session Equipment Utilized During Treatment: Rolling walker;Other (comment) (3 in 1)   Activity Tolerance Patient tolerated treatment well   Patient Left with call bell/phone within reach;with family/visitor present;in bed   Nurse Communication      Functional Assessment Tool Used: clinical judgement Functional Limitation: Self care Self Care Current Status [id number]): At least  1 percent but less than 20 percent impaired, limited or restricted Self Care Goal Status [id number]): At least 1 percent but less than 20 percent impaired, limited or restricted   Time: [phone] OT Time Calculation (min): 13 min  Charges: OT G-codes **NOT FOR INPATIENT CLASS** Functional Assessment Tool Used: clinical judgement Functional Limitation: Self care Self Care Current Status [id number]): At least 1 percent but less than 20 percent impaired, limited or restricted Self Care Goal Status [id number]): At least 1 percent but less than 20 percent impaired, limited or restricted  Britt Bottom 06/22/2016, 9:52 AM

## 2016-06-22 NOTE — Progress Notes (Signed)
Physical Therapy Treatment Patient Details Name: Kelly Sharp MRN: [id number] DOB: September 06, 1975 Today's Date: 06/22/2016    History of Present Illness MICRO LUMBAR DECOMPRESSION L5-S1 RIGHT DECOMPRESSIVE LUMBAR LAMINECTOMY LEVEL 1 (Right)    PT Comments    Ready for DC.  Follow Up Recommendations  No PT follow up     Equipment Recommendations  Rolling walker with 5" wheels    Recommendations for Other Services       Precautions / Restrictions Precautions Precautions: Back Precaution Comments: reviewed back precautions with pt and husband    Mobility  Bed Mobility   Bed Mobility: Sidelying to Sit Rolling: Modified independent (Device/Increase time) Sidelying to sit: Modified independent (Device/Increase time)       General bed mobility comments: pt and her husband educated on log roll , min A for sequencing/technique  Transfers Overall transfer level: Needs assistance Equipment used: Rolling walker (2 wheeled) Transfers: Sit to/from Stand Sit to Stand: Supervision         General transfer comment: cues for correct hand placement  Ambulation/Gait Ambulation/Gait assistance: Supervision Ambulation Distance (Feet): 200 Feet Assistive device: Rolling walker (2 wheeled) Gait Pattern/deviations: Step-through pattern     General Gait Details: cues for posture, relies on the arms   Stairs Stairs: Yes   Stair Management: No rails;Step to pattern;Forwards Number of Stairs: 4 General stair comments: used wall simulation and HHA  Wheelchair Mobility    Modified Rankin (Stroke Patients Only)       Balance                                    Cognition Arousal/Alertness: Awake/alert                          Exercises      General Comments        Pertinent Vitals/Pain Pain Score: 3  Pain Location: back Pain Descriptors / Indicators: Aching;Sore Pain Intervention(s): Monitored during session;Premedicated before session     Home Living                      Prior Function            PT Goals (current goals can now be found in the care plan section) Progress towards PT goals: Progressing toward goals    Frequency           PT Plan Current plan remains appropriate    Co-evaluation             End of Session   Activity Tolerance: Patient tolerated treatment well Patient left: in chair;with call bell/phone within reach     Time: 0826-0836 PT Time Calculation (min) (ACUTE ONLY): 10 min  Charges:  $Gait Training: 8-22 mins                    G Codes:      Kelly Sharp 06/22/2016, 2:31 PM

## 2016-06-22 NOTE — Progress Notes (Signed)
Subjective: 2 Days Post-Op Procedure(s) (LRB): MICRO LUMBAR DECOMPRESSION L5-S1 RIGHT DECOMPRESSIVE LUMBAR LAMINECTOMY LEVEL 1 (Right) Patient reports pain as mild. Pain improved from yesterday and feels she will be able to go home today. Leg pain resolved. Back pain improved. No other c/o.  Objective: Vital signs in last 24 hours: Temp:  [98.7 F (37.1 C)-99.5 F (37.5 C)] 98.8 F (37.1 C) (12/29 0625) Pulse Rate:  [70-90] 70 (12/29 0625) Resp:  [14-16] 14 (12/29 0625) BP: (110-125)/(45-71) 125/71 (12/29 0625) SpO2:  [94 %-98 %] 96 % (12/29 0625)  Intake/Output from previous day: 12/28 0701 - 12/29 0700 In: 1240.8 [P.O.:960; I.V.:280.8] Out: -  Intake/Output this shift: No intake/output data recorded.  No results for input(s): HGB in the last 72 hours. No results for input(s): WBC, RBC, HCT, PLT in the last 72 hours. No results for input(s): NA, K, CL, CO2, BUN, CREATININE, GLUCOSE, CALCIUM in the last 72 hours. No results for input(s): LABPT, INR in the last 72 hours.  Neurologically intact ABD soft Neurovascular intact Sensation intact distally Intact pulses distally Dorsiflexion/Plantar flexion intact Incision: dressing C/D/I and no drainage No cellulitis present Compartment soft no sign of DVT  Assessment/Plan: 2 Days Post-Op Procedure(s) (LRB): MICRO LUMBAR DECOMPRESSION L5-S1 RIGHT DECOMPRESSIVE LUMBAR LAMINECTOMY LEVEL 1 (Right) Advance diet Up with therapy D/C IV fluids  D/C home today Discussed D/C instructions, Lspine precautions, dressing instructions  BISSELL, JACLYN M. 06/22/2016, 7:57 AM

## 2016-07-17 DIAGNOSIS — M5136 Other intervertebral disc degeneration, lumbar region: Secondary | ICD-10-CM | POA: Diagnosis not present

## 2016-07-19 DIAGNOSIS — M5136 Other intervertebral disc degeneration, lumbar region: Secondary | ICD-10-CM | POA: Diagnosis not present

## 2016-07-24 DIAGNOSIS — M5136 Other intervertebral disc degeneration, lumbar region: Secondary | ICD-10-CM | POA: Diagnosis not present

## 2016-07-26 DIAGNOSIS — M5136 Other intervertebral disc degeneration, lumbar region: Secondary | ICD-10-CM | POA: Diagnosis not present

## 2016-07-31 DIAGNOSIS — M5136 Other intervertebral disc degeneration, lumbar region: Secondary | ICD-10-CM | POA: Diagnosis not present

## 2016-08-02 DIAGNOSIS — M5136 Other intervertebral disc degeneration, lumbar region: Secondary | ICD-10-CM | POA: Diagnosis not present

## 2016-08-14 DIAGNOSIS — M545 Low back pain: Secondary | ICD-10-CM | POA: Diagnosis not present

## 2016-08-14 DIAGNOSIS — M5136 Other intervertebral disc degeneration, lumbar region: Secondary | ICD-10-CM | POA: Diagnosis not present

## 2016-08-16 DIAGNOSIS — M545 Low back pain: Secondary | ICD-10-CM | POA: Diagnosis not present

## 2016-08-16 DIAGNOSIS — M5136 Other intervertebral disc degeneration, lumbar region: Secondary | ICD-10-CM | POA: Diagnosis not present

## 2016-08-21 DIAGNOSIS — M5136 Other intervertebral disc degeneration, lumbar region: Secondary | ICD-10-CM | POA: Diagnosis not present

## 2016-08-21 DIAGNOSIS — M545 Low back pain: Secondary | ICD-10-CM | POA: Diagnosis not present

## 2016-08-23 DIAGNOSIS — M545 Low back pain: Secondary | ICD-10-CM | POA: Diagnosis not present

## 2016-08-23 DIAGNOSIS — M5136 Other intervertebral disc degeneration, lumbar region: Secondary | ICD-10-CM | POA: Diagnosis not present

## 2016-08-27 DIAGNOSIS — M545 Low back pain: Secondary | ICD-10-CM | POA: Diagnosis not present

## 2016-08-27 DIAGNOSIS — M5136 Other intervertebral disc degeneration, lumbar region: Secondary | ICD-10-CM | POA: Diagnosis not present

## 2016-08-28 DIAGNOSIS — M5136 Other intervertebral disc degeneration, lumbar region: Secondary | ICD-10-CM | POA: Diagnosis not present

## 2016-08-28 DIAGNOSIS — M545 Low back pain: Secondary | ICD-10-CM | POA: Diagnosis not present

## 2016-08-30 DIAGNOSIS — M545 Low back pain: Secondary | ICD-10-CM | POA: Diagnosis not present

## 2016-08-30 DIAGNOSIS — M5136 Other intervertebral disc degeneration, lumbar region: Secondary | ICD-10-CM | POA: Diagnosis not present

## 2016-09-05 DIAGNOSIS — M5136 Other intervertebral disc degeneration, lumbar region: Secondary | ICD-10-CM | POA: Diagnosis not present

## 2016-09-05 DIAGNOSIS — M545 Low back pain: Secondary | ICD-10-CM | POA: Diagnosis not present

## 2016-09-12 DIAGNOSIS — M545 Low back pain: Secondary | ICD-10-CM | POA: Diagnosis not present

## 2016-09-12 DIAGNOSIS — M5136 Other intervertebral disc degeneration, lumbar region: Secondary | ICD-10-CM | POA: Diagnosis not present

## 2016-09-18 DIAGNOSIS — M5136 Other intervertebral disc degeneration, lumbar region: Secondary | ICD-10-CM | POA: Diagnosis not present

## 2016-09-18 DIAGNOSIS — M545 Low back pain: Secondary | ICD-10-CM | POA: Diagnosis not present

## 2016-09-19 DIAGNOSIS — M545 Low back pain: Secondary | ICD-10-CM | POA: Diagnosis not present

## 2016-09-19 DIAGNOSIS — M5136 Other intervertebral disc degeneration, lumbar region: Secondary | ICD-10-CM | POA: Diagnosis not present

## 2016-09-26 DIAGNOSIS — M5136 Other intervertebral disc degeneration, lumbar region: Secondary | ICD-10-CM | POA: Diagnosis not present

## 2016-09-26 DIAGNOSIS — M545 Low back pain: Secondary | ICD-10-CM | POA: Diagnosis not present

## 2016-10-03 DIAGNOSIS — M545 Low back pain: Secondary | ICD-10-CM | POA: Diagnosis not present

## 2016-10-03 DIAGNOSIS — M5136 Other intervertebral disc degeneration, lumbar region: Secondary | ICD-10-CM | POA: Diagnosis not present

## 2016-10-30 DIAGNOSIS — M4306 Spondylolysis, lumbar region: Secondary | ICD-10-CM | POA: Diagnosis not present

## 2016-11-01 DIAGNOSIS — M4306 Spondylolysis, lumbar region: Secondary | ICD-10-CM | POA: Diagnosis not present

## 2016-11-05 DIAGNOSIS — M4306 Spondylolysis, lumbar region: Secondary | ICD-10-CM | POA: Diagnosis not present

## 2016-11-13 DIAGNOSIS — M4306 Spondylolysis, lumbar region: Secondary | ICD-10-CM | POA: Diagnosis not present

## 2016-12-24 DIAGNOSIS — M4306 Spondylolysis, lumbar region: Secondary | ICD-10-CM | POA: Diagnosis not present

## 2017-01-13 DIAGNOSIS — H1011 Acute atopic conjunctivitis, right eye: Secondary | ICD-10-CM | POA: Diagnosis not present

## 2017-02-20 DIAGNOSIS — J069 Acute upper respiratory infection, unspecified: Secondary | ICD-10-CM | POA: Diagnosis not present

## 2017-09-10 DIAGNOSIS — M5136 Other intervertebral disc degeneration, lumbar region: Secondary | ICD-10-CM | POA: Diagnosis not present

## 2017-09-10 DIAGNOSIS — M545 Low back pain: Secondary | ICD-10-CM | POA: Diagnosis not present

## 2017-12-11 DIAGNOSIS — J069 Acute upper respiratory infection, unspecified: Secondary | ICD-10-CM | POA: Diagnosis not present

## 2017-12-11 DIAGNOSIS — J029 Acute pharyngitis, unspecified: Secondary | ICD-10-CM | POA: Diagnosis not present

## 2018-03-03 DIAGNOSIS — M545 Low back pain: Secondary | ICD-10-CM | POA: Diagnosis not present

## 2018-03-07 DIAGNOSIS — M545 Low back pain: Secondary | ICD-10-CM | POA: Diagnosis not present

## 2018-03-11 DIAGNOSIS — M545 Low back pain: Secondary | ICD-10-CM | POA: Diagnosis not present

## 2018-03-13 DIAGNOSIS — Z124 Encounter for screening for malignant neoplasm of cervix: Secondary | ICD-10-CM | POA: Diagnosis not present

## 2018-03-13 DIAGNOSIS — Z01419 Encounter for gynecological examination (general) (routine) without abnormal findings: Secondary | ICD-10-CM | POA: Diagnosis not present

## 2018-03-14 DIAGNOSIS — M545 Low back pain: Secondary | ICD-10-CM | POA: Diagnosis not present

## 2018-03-17 DIAGNOSIS — M545 Low back pain: Secondary | ICD-10-CM | POA: Diagnosis not present

## 2018-03-21 DIAGNOSIS — M545 Low back pain: Secondary | ICD-10-CM | POA: Diagnosis not present

## 2018-03-24 DIAGNOSIS — M545 Low back pain: Secondary | ICD-10-CM | POA: Diagnosis not present

## 2018-04-01 DIAGNOSIS — M545 Low back pain: Secondary | ICD-10-CM | POA: Diagnosis not present

## 2018-04-03 DIAGNOSIS — M545 Low back pain: Secondary | ICD-10-CM | POA: Diagnosis not present

## 2018-04-11 DIAGNOSIS — M545 Low back pain: Secondary | ICD-10-CM | POA: Diagnosis not present

## 2018-04-18 DIAGNOSIS — M545 Low back pain: Secondary | ICD-10-CM | POA: Diagnosis not present

## 2018-04-28 DIAGNOSIS — Z1231 Encounter for screening mammogram for malignant neoplasm of breast: Secondary | ICD-10-CM | POA: Diagnosis not present

## 2018-04-28 DIAGNOSIS — D251 Intramural leiomyoma of uterus: Secondary | ICD-10-CM | POA: Diagnosis not present

## 2018-04-28 DIAGNOSIS — D250 Submucous leiomyoma of uterus: Secondary | ICD-10-CM | POA: Diagnosis not present

## 2018-04-28 DIAGNOSIS — D252 Subserosal leiomyoma of uterus: Secondary | ICD-10-CM | POA: Diagnosis not present

## 2018-05-06 ENCOUNTER — Encounter (INDEPENDENT_AMBULATORY_CARE_PROVIDER_SITE_OTHER): Payer: Self-pay

## 2018-05-13 ENCOUNTER — Ambulatory Visit (INDEPENDENT_AMBULATORY_CARE_PROVIDER_SITE_OTHER): Payer: Self-pay | Admitting: Bariatrics

## 2018-06-03 ENCOUNTER — Ambulatory Visit (INDEPENDENT_AMBULATORY_CARE_PROVIDER_SITE_OTHER): Payer: Self-pay | Admitting: Bariatrics

## 2018-07-29 IMAGING — DX DG SPINE 1V PORT
1 series · 1 of 1 positions shown · non-contrast
Comparison: Earlier localization imaging, 06/20/2016 at [DATE] p.m.

CLINICAL DATA: Portable lateral lumbar spine imaging for surgical
localization.

EXAM:
PORTABLE SPINE - 1 VIEW

[l-spine lat]
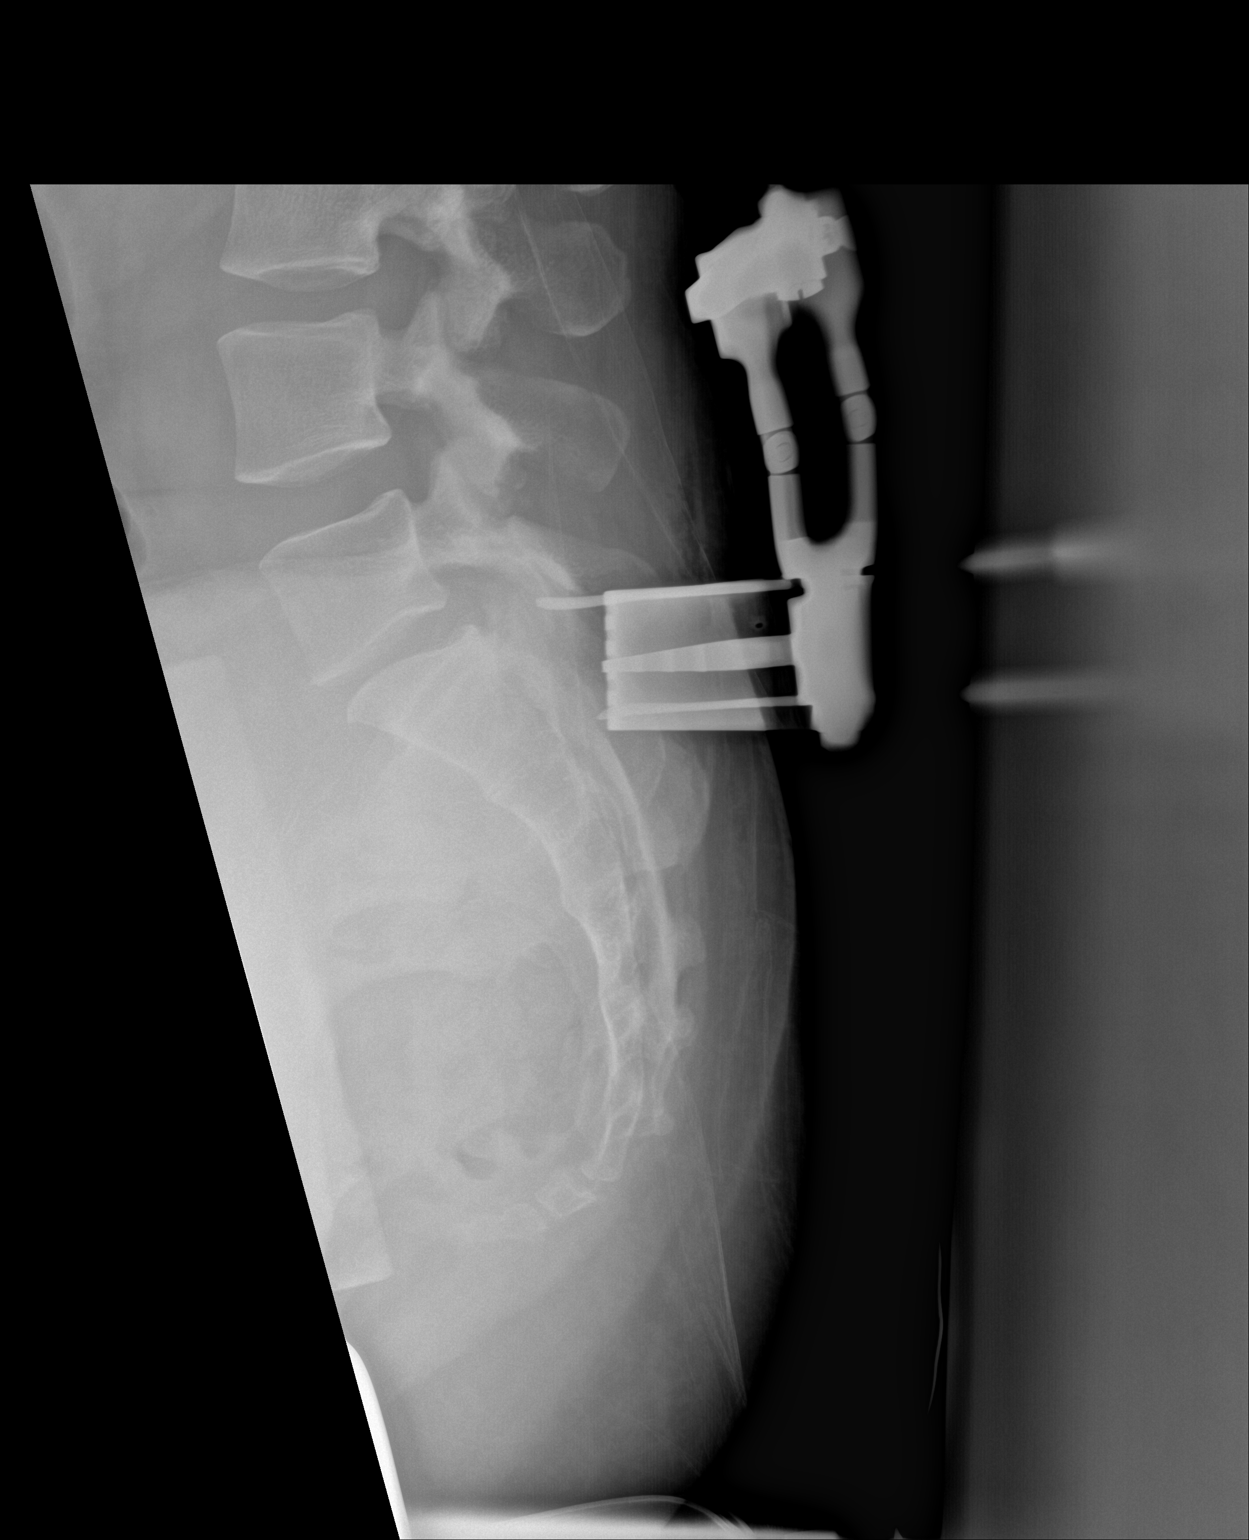

[1 of 1 positions shown; findings below may reference images not displayed]

FINDINGS: Two surgical probes of been inserted. The more superior has its tip
projecting 2 cm posterior to the posterior margin of the L5-S1 disc.
The more inferior has its tip projecting 13 mm posterior to the
posterior lower margin of S1.
IMPRESSION: Surgical localization imaging as described.

## 2018-07-29 IMAGING — DX DG SPINE 1V PORT
1 series · 1 of 1 positions shown · non-contrast
Comparison: Lumbar spine radiographs 06/14/2016

CLINICAL DATA: Intraoperative localization for spine surgery.

EXAM:
PORTABLE SPINE - 1 VIEW

[l-spine lat]
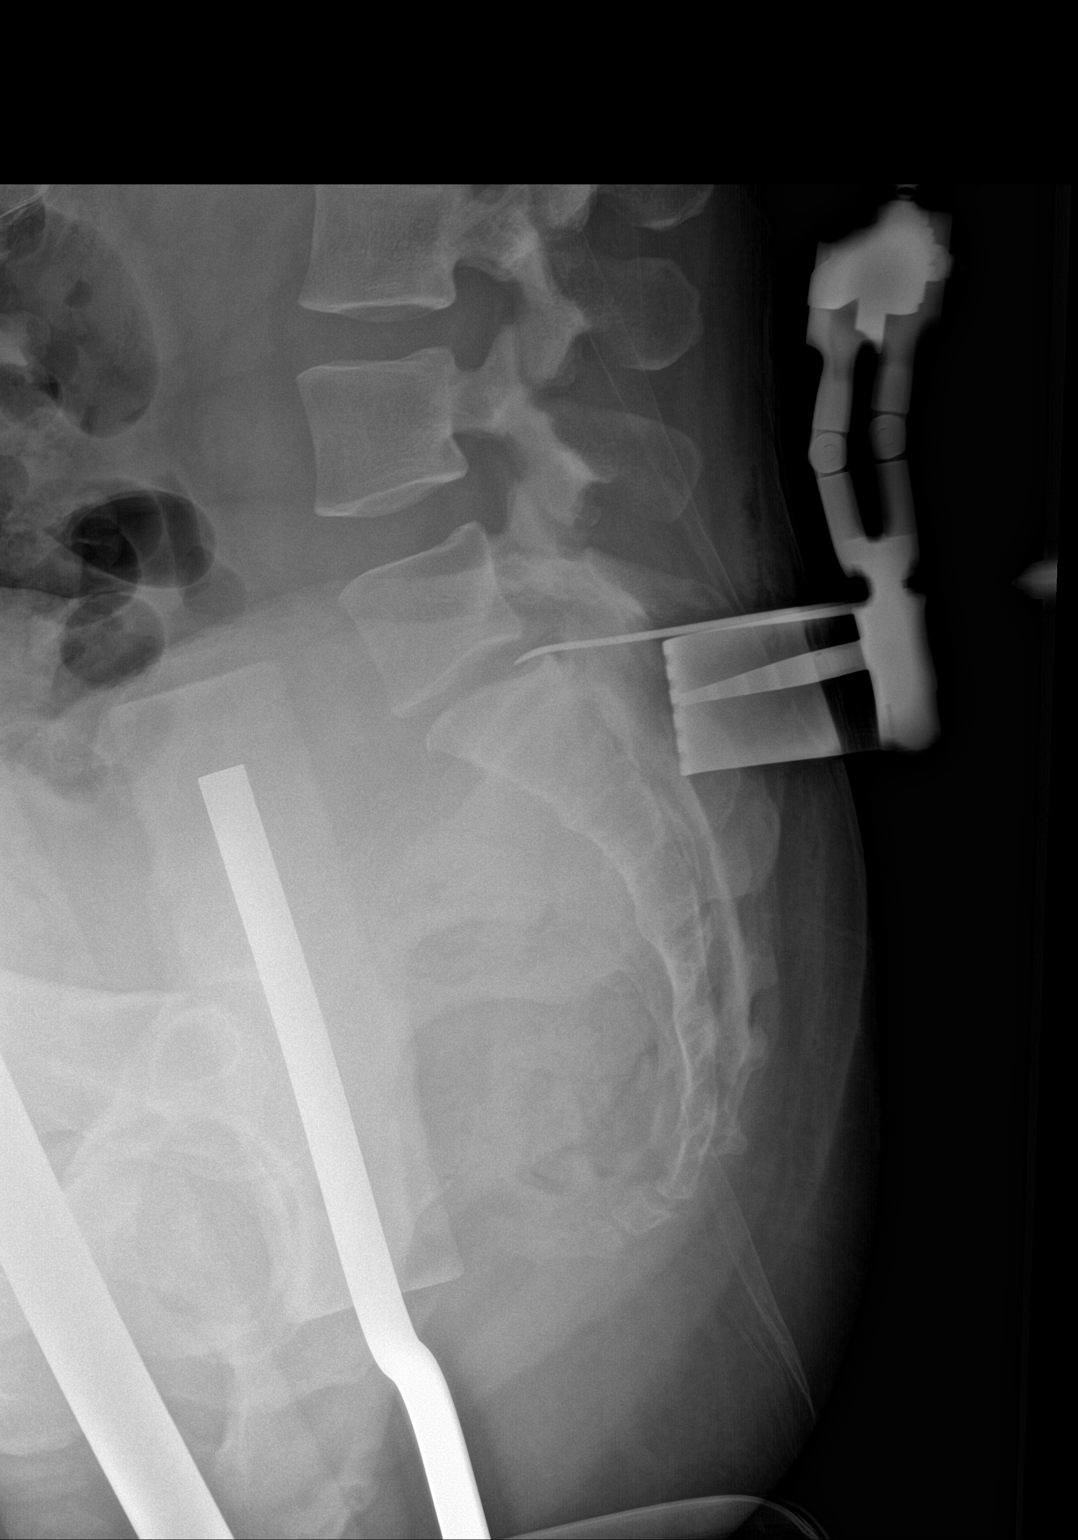

[1 of 1 positions shown; findings below may reference images not displayed]

FINDINGS: Lateral lumbar spine film labeled 3 demonstrates a surgical
instrument at the L5-S1 disc space.
IMPRESSION: L5-S1 marked intraoperatively.

## 2022-08-07 ENCOUNTER — Other Ambulatory Visit: Payer: Self-pay | Admitting: Obstetrics and Gynecology

## 2022-08-07 DIAGNOSIS — D250 Submucous leiomyoma of uterus: Secondary | ICD-10-CM

## 2022-08-14 ENCOUNTER — Other Ambulatory Visit: Payer: Self-pay | Admitting: Interventional Radiology

## 2022-08-14 ENCOUNTER — Ambulatory Visit
Admission: RE | Admit: 2022-08-14 | Discharge: 2022-08-14 | Disposition: A | Discharge status: Home / Self Care | Payer: BC Managed Care – PPO | Source: Ambulatory Visit | Attending: Obstetrics and Gynecology | Admitting: Obstetrics and Gynecology

## 2022-08-14 DIAGNOSIS — D250 Submucous leiomyoma of uterus: Secondary | ICD-10-CM

## 2022-08-14 HISTORY — PX: IR RADIOLOGIST EVAL & MGMT: IMG5224

## 2022-08-14 NOTE — Consult Note (Signed)
Chief Complaint: Menometrorrhagia  Referring Physician(s): Cousins,Sheronette  History of Present Illness: Kelly Sharp is a 47 y.o. female presenting today as a scheduled consultation for symptomatic uterine fibroids, kindly referred by by Dr. Garwin Brothers.  She complains of 1 of 3 categories of symptoms of fibroids, bleeding.  She does not have significant pressure or pain.   She tells me that regarding her bleeding symptoms, she has had worsening over the past year.  Her bleeding cycles are prolonged and heavy.  Since changing her birth control, they amount of bleeding has decreased but the length of her periods is still prolonged.  She has had bleeding continuously for the past 3 weeks.  She has 2 children, and says that she has no intention of further pregnancy.   Past Medical History:  Diagnosis Date   Allergy    Arthritis    L knee   Conjunctivitis    reported by patient at preop on 06/14/16  patient reports approx 05/30/2016    Mitral valve prolapse    ? as a teenager    Past Surgical History:  Procedure Laterality Date   DECOMPRESSIVE LUMBAR LAMINECTOMY LEVEL 1 Right 06/20/2016   Procedure: MICRO LUMBAR DECOMPRESSION L5-S1 RIGHT DECOMPRESSIVE LUMBAR LAMINECTOMY LEVEL 1;  Surgeon: Susa Day, MD;  Location: [facility];  Service: Orthopedics;  Laterality: Right;  Requests 2 hours   NO PAST SURGERIES      Allergies: Patient has no known allergies.  Medications: Prior to Admission medications   Medication Sig Start Date End Date Taking? Authorizing Provider  docusate sodium (COLACE) 100 MG capsule Take 1 capsule (100 mg total) by mouth 2 (two) times daily as needed for mild constipation. 06/20/16   Susa Day, MD  methocarbamol (ROBAXIN) 500 MG tablet Take 1 tablet (500 mg total) by mouth every 6 (six) hours as needed for muscle spasms. 06/20/16   Susa Day, MD  Multiple Vitamin (MULTIVITAMIN WITH MINERALS) TABS tablet Take 2 tablets by mouth at bedtime.  May resume 5 days post-op - X Factor Plus 06/21/16   Lacie Draft M, PA-C  oxyCODONE-acetaminophen (PERCOCET/ROXICET) 5-325 MG tablet Take 1-2 tablets by mouth every 4 (four) hours as needed for moderate pain or severe pain. 06/22/16   Cecilie Kicks, PA-C  polyethylene glycol (MIRALAX / GLYCOLAX) packet Take 17 g by mouth daily. 06/20/16   Susa Day, MD  Probiotic Product (PROBIOTIC PO) Take 2 capsules by mouth at bedtime. Plexus ProBio 5    [provider]  TRI-PREVIFEM 0.18/0.215/0.25 MG-35 MCG tablet Take 1 tablet by mouth every evening. 04/19/16   [provider]  trimethoprim-polymyxin b (POLYTRIM) ophthalmic solution Place 1 drop into both eyes 2 (two) times daily. 05/08/16   [provider]     Family History  Problem Relation Age of Onset   Thyroid disease Mother    Diabetes Father    Hyperlipidemia Father    Diabetes Maternal Grandmother    Diabetes Paternal Grandmother     Social History   Socioeconomic History   Marital status: Married    Spouse name: Not on file   Number of children: Not on file   Years of education: Not on file   Highest education level: Not on file  Occupational History   Not on file  Tobacco Use   Smoking status: Never   Smokeless tobacco: Never  Substance and Sexual Activity   Alcohol use: Yes    Comment: occassionally   Drug use: No   Sexual activity:  Yes    Birth control/protection: Pill    Comment: 1  Other Topics Concern   Not on file  Social History Narrative   Not on file   Social Determinants of Health   Financial Resource Strain: Not on file  Food Insecurity: Not on file  Transportation Needs: Not on file  Physical Activity: Not on file  Stress: Not on file  Social Connections: Not on file   Review of Systems: A 12 point ROS discussed and pertinent positives are indicated in the HPI above.  All other systems are negative.  Review of Systems  Vital Signs: BP (!) 137/94 (BP Location:  Left Arm, Patient Position: Sitting, Cuff Size: Normal)   Pulse 72   Temp 98.3 F (36.8 C) (Oral)   Wt 77.6 kg   SpO2 99% Comment: room air  BMI 28.46 kg/m   Physical Exam Cardiovascular:     Rate and Rhythm: Normal rate.     Heart sounds: Normal heart sounds.  Pulmonary:     Effort: Pulmonary effort is normal.     Breath sounds: Normal breath sounds.  Abdominal:     General: Abdomen is flat. Bowel sounds are normal.     Palpations: Abdomen is soft.  Skin:    General: Skin is warm and dry.  Neurological:     Mental Status: She is oriented to person, place, and time.  Psychiatric:        Mood and Affect: Mood normal.        Behavior: Behavior normal.   Imaging: No results found.  Labs:  CBC: No results for input(s): "WBC", "HGB", "HCT", "PLT" in the last 8760 hours.  COAGS: No results for input(s): "INR", "APTT" in the last 8760 hours.  BMP: No results for input(s): "NA", "K", "CL", "CO2", "GLUCOSE", "BUN", "CALCIUM", "CREATININE", "GFRNONAA", "GFRAA" in the last 8760 hours.  Invalid input(s): "CMP"  LIVER FUNCTION TESTS: No results for input(s): "BILITOT", "AST", "ALT", "ALKPHOS", "PROT", "ALBUMIN" in the last 8760 hours.  TUMOR MARKERS: No results for input(s): "AFPTM", "CEA", "CA199", "CHROMGRNA" in the last 8760 hours.  Assessment and Plan:  47 year old female presenting to IR clinic for discussion of symptomatic uterine fibroids.  Her primary symptoms are bleeding, and she has previously tried hormonal treatments.    She is a good candidate for Uterine Artery Embolization.  I would like to obtain an endometrial biopsy to exclude malignancy given her prolonged bleeding history.  We will also obtain an MRI to assess anatomy of the Fibroids.  I had a lengthy conversation with her regarding the anatomy, pathology, and treatment options for fibroids.  She was counseled on options for treatment of uterine fibroids including doing nothing, hormonal therapy,  myomectomy, hysterectomy, and uterine artery embolization.  Regarding UFE, we had a discussion the efficacy, expectations regarding outcomes/long term efficacy, and risk/benefit.    I shared with her a summary of the literature for UFE efficacy, which generally is accepted to be adequate symptom relief with good restoration of quality of life at 1 year in 90% or greater of patients for bleeding > pain > bulk symptoms.  I also discussed with her that there is a cited rate of retreatment in ~15-20% of patients in the long term, with overall excellent long term utility of symptom relief and QOL.    We discussed the expectations not just for the treatment day and possible post treatment admission to 23 hour observation, but the first 3 months, 6 months - 12  months, and our follow up.  I recommended that she take 1 week off of work to allow her body adequate recovery time.   Regarding risks, specific risks discussed include: post-embolization syndrome, bleeding, infection, contrast reaction, kidney/artery injury, need for further surgery/procedure, including hysterectomy, need for hospitalization, cardiopulmonary collapse, death.    We discussed an approximate 7% chance of early onset menopause, mostly in women >39 years of age, with a less than 1% risk for women less than 32 years of age.  We discussed the need to evaluate the ovarian arteries and in some cases the need to treat the fibroids via the ovarian arteries which can lead to a higher risk of early menopause.    Regarding post-embolization syndrome, I did let her know that this is essentially expected, with a typical prodromal syndrome lasting 4-7 days typically, and usually treated with medication/support such as hydration, rest, analgesics, PO nausea medications, and stool softeners.   Plan:  - Refer to Dr. Garwin Brothers for Endometrial biopsy - Obtain MRI to evaluate uterus/fibroids - Tentatively to proceed with uterine artery embolization at first  available, with Dr. Dwaine Gale  Thank you for this interesting consult.  I greatly enjoyed meeting Kelly Sharp and look forward to participating in their care.  A copy of this report was sent to the requesting provider on this date.  Electronically Signed: Paula Libra Myson Levi 08/14/2022, 1:45 PM   I spent a total of  30 Minutes  in face to face in clinical consultation, greater than 50% of which was counseling/coordinating care for symptomatic uterine fibroids.

## 2022-08-18 ENCOUNTER — Ambulatory Visit (HOSPITAL_COMMUNITY): Payer: BC Managed Care – PPO

## 2022-08-18 ENCOUNTER — Encounter (HOSPITAL_COMMUNITY): Payer: Self-pay

## 2022-08-20 ENCOUNTER — Telehealth: Payer: Self-pay | Admitting: *Deleted

## 2022-08-20 NOTE — Telephone Encounter (Signed)
Patient was scheduled for Mri pelvis, Pre UFE. She cancelled Mri due to cost and states she cannot have at this time. She will call back when she is ready to reschedule Mri for UFE./vm

## 2022-09-28 ENCOUNTER — Other Ambulatory Visit: Payer: Self-pay | Admitting: Obstetrics and Gynecology

## 2022-09-28 DIAGNOSIS — N920 Excessive and frequent menstruation with regular cycle: Secondary | ICD-10-CM

## 2022-10-22 ENCOUNTER — Other Ambulatory Visit: Payer: Self-pay | Admitting: Obstetrics and Gynecology

## 2022-10-22 ENCOUNTER — Ambulatory Visit
Admission: RE | Admit: 2022-10-22 | Discharge: 2022-10-22 | Disposition: A | Discharge status: Home / Self Care | Payer: BC Managed Care – PPO | Source: Ambulatory Visit | Attending: Obstetrics and Gynecology | Admitting: Obstetrics and Gynecology

## 2022-10-22 DIAGNOSIS — N920 Excessive and frequent menstruation with regular cycle: Secondary | ICD-10-CM

## 2023-03-01 ENCOUNTER — Other Ambulatory Visit: Payer: Self-pay | Admitting: Obstetrics and Gynecology

## 2023-03-06 LAB — SURGICAL PATHOLOGY

## 2023-03-18 ENCOUNTER — Other Ambulatory Visit: Payer: Self-pay | Admitting: Obstetrics and Gynecology

## 2023-03-18 DIAGNOSIS — E041 Nontoxic single thyroid nodule: Secondary | ICD-10-CM

## 2023-03-26 ENCOUNTER — Ambulatory Visit
Admission: RE | Admit: 2023-03-26 | Discharge: 2023-03-26 | Disposition: A | Discharge status: Home / Self Care | Payer: BC Managed Care – PPO | Source: Ambulatory Visit | Attending: Obstetrics and Gynecology | Admitting: Obstetrics and Gynecology

## 2023-03-26 DIAGNOSIS — E041 Nontoxic single thyroid nodule: Secondary | ICD-10-CM

## 2023-04-08 ENCOUNTER — Ambulatory Visit: Payer: BC Managed Care – PPO | Admitting: Dermatology

## 2023-04-08 ENCOUNTER — Encounter: Payer: Self-pay | Admitting: Dermatology

## 2023-04-08 VITALS — BP 123/81 | HR 87

## 2023-04-08 DIAGNOSIS — L6681 Central centrifugal cicatricial alopecia: Secondary | ICD-10-CM | POA: Diagnosis not present

## 2023-04-08 MED ORDER — SAFETY SEAL MISCELLANEOUS MISC: 1.0000 | Freq: Every day | 4 refills | Status: DC

## 2023-04-08 NOTE — Progress Notes (Signed)
   New Patient Visit   Subjective  Kelly Sharp is a 47 y.o. female who presents for the following: hair thinning  Pt here today for information on hair thinning. She first noticed a few years ago. Pt uses hair oil 3-4 times a week. Pt states she hasn't chemically straightened her hair in more than 10 years and she doesn't do tight hair styles/braids.    The following portions of the chart were reviewed this encounter and updated as appropriate: medications, allergies, medical history  Review of Systems:  No other skin or systemic complaints except as noted in HPI or Assessment and Plan.  Objective  Well appearing patient in no apparent distress; mood and affect are within normal limits.  A focused examination was performed of the following areas: scalp  Relevant exam findings are noted in the Assessment and Plan.  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   Chronic and persistent condition with duration or expected duration over one year. Condition is bothersome/symptomatic for patient. Currently flared.   CCCA is a chronic condition related to genetics and/or hormonal changes.  In women CCCA is commonly associated with menopause but may occur any time after puberty.  It causes hair thinning primarily on the crown with widening of the part and temporal hairline recession.  Can use OTC Rogaine (minoxidil) 5% solution/foam as directed.  Oral treatments in female patients who have no contraindication may include :   Treatment Plan: -Start Vivisal daily and Vital protein collagen powder. -Continue to use Mielle rosemary oil a few times a week.  -Start applying a mixture of clobetasol and minoxidil to the scalp once daily in the morning.  Long term medication management.  Patient  is using long term (months to years) prescription medication  to control their dermatologic condition.  These medications require periodic monitoring to evaluate for efficacy and side effects and may require periodic laboratory monitoring.    Return in about 3 months (around 07/09/2023) for CCCA.  Owens Shark, CMA, am acting as scribe for [employer], DO.   Documentation: I have reviewed the above documentation for accuracy and completeness, and I agree with the above.  Langston Reusing, DO

## 2023-04-08 NOTE — Patient Instructions (Addendum)
  Hello Luna Kitchens,  Thank you for visiting my office today. Your dedication to addressing your scalp concerns and enhancing your overall hair health is greatly appreciated. Below is a summary of the essential instructions from our discussion today:  Diagnosis: Central Cicatricial Centrifugal Alopecia - Medication: Start applying a mixture of clobetasol and minoxidil to your scalp once daily in the morning. This prescription will be prepared by [facility] and will cost $45. It will be mailed directly to you.  - Hair Care: Please continue to use Mielle rosemary oil a few times a week. Keep an eye out for potential dandruff as a side effect.  - Supplements: Begin taking Viviscal oral supplements, which you can find at [employer], [employer], or online. Additionally, consider incorporating Vital Proteins Collagen Powder into your daily regimen for added benefits.  - Follow-Up: We should schedule a follow-up appointment in three months to monitor your progress and make any necessary adjustments to your treatment plan.  Included in your after-visit summary are pictures and further details about the products recommended. Should you have any questions or concerns before our next meeting, please do not hesitate to reach out to our office.  Warm regards,  Dr. Langston Reusing [facility]

## 2023-04-11 ENCOUNTER — Other Ambulatory Visit: Payer: Self-pay | Admitting: Obstetrics and Gynecology

## 2023-04-11 ENCOUNTER — Other Ambulatory Visit: Payer: Self-pay | Admitting: Pediatrics

## 2023-04-12 ENCOUNTER — Other Ambulatory Visit: Payer: Self-pay

## 2023-04-12 ENCOUNTER — Encounter (HOSPITAL_COMMUNITY): Payer: Self-pay | Admitting: Obstetrics and Gynecology

## 2023-04-12 NOTE — Progress Notes (Signed)
PCP - Deatra James, MD Cardiologist -   PPM/ICD - denies Device Orders - n/a Rep Notified - n/a  Chest x-ray - denies EKG - denies Stress Test - denies ECHO - denies Cardiac Cath - denies  CPAP - denies  DM - denies  Blood Thinner Instructions: denies Aspirin Instructions: n/a  ERAS Protcol - clear liquids until 6:00 a.m.  COVID TEST- n/a  Anesthesia review: no  Patient verbally denies any shortness of breath, fever, cough and chest pain during phone call   -------------  SDW INSTRUCTIONS given:  Your procedure is scheduled on April 15, 2023.  Report to [facility] Main Entrance "A" at 6:30 A.M., and check in at the Admitting office.  Call this number if you have problems the morning of surgery:  [phone]   Remember:  Do not eat after midnight the night before your surgery  You may drink clear liquids until 6:00 the morning of your surgery.   Clear liquids allowed are: Water, Non-Citrus Juices (without pulp), Carbonated Beverages, Clear Tea, Black Coffee Only, and Gatorade    Take these medicines the morning of surgery with A SIP OF WATER  norethindrone (AYGESTIN)   IF NEEDED  As of today, STOP taking any Aspirin (unless otherwise instructed by your surgeon) Aleve, Naproxen, Ibuprofen, Motrin, Advil, Goody's, BC's, all herbal medications, fish oil, and all vitamins.                      Do not wear jewelry, make up, or nail polish            Do not wear lotions, powders, perfumes/colognes, or deodorant.            Do not shave 48 hours prior to surgery.  Men may shave face and neck.            Do not bring valuables to the hospital.            [facility] is not responsible for any belongings or valuables.  Do NOT Smoke (Tobacco/Vaping) 24 hours prior to your procedure If you use a CPAP at night, you may bring all equipment for your overnight stay.   Contacts, glasses, dentures or bridgework may not be worn into surgery.      For patients admitted to  the hospital, discharge time will be determined by your treatment team.   Patients discharged the day of surgery will not be allowed to drive home, and someone needs to stay with them for 24 hours.    Special instructions:   [facility]- Preparing For Surgery  Before surgery, you can play an important role. Because skin is not sterile, your skin needs to be as free of germs as possible. You can reduce the number of germs on your skin by washing with CHG (chlorahexidine gluconate) Soap before surgery.  CHG is an antiseptic cleaner which kills germs and bonds with the skin to continue killing germs even after washing.    Oral Hygiene is also important to reduce your risk of infection.  Remember - BRUSH YOUR TEETH THE MORNING OF SURGERY WITH YOUR REGULAR TOOTHPASTE  Please do not use if you have an allergy to CHG or antibacterial soaps. If your skin becomes reddened/irritated stop using the CHG.  Do not shave (including legs and underarms) for at least 48 hours prior to first CHG shower. It is OK to shave your face.  Please follow these instructions carefully.   Shower the [employer]  SUR[facility]ERY and the MORNING OF SURGERY with DIAL Soap.   Pat yourself dry with a CLEAN TOWEL.  Wear CLEAN PAJAMAS to bed the night before surgery  Place CLEAN SHEETS on your bed the night of your first shower and DO NOT SLEEP WITH PETS.   Day of Surgery: Please shower morning of surgery  Wear Clean/Comfortable clothing the morning of surgery Do not apply any deodorants/lotions.   Remember to brush your teeth WITH YOUR REGULAR TOOTHPASTE.   Questions were answered. Patient verbalized understanding of instructions.

## 2023-04-14 ENCOUNTER — Encounter (HOSPITAL_COMMUNITY): Payer: Self-pay | Admitting: Obstetrics and Gynecology

## 2023-04-14 NOTE — Anesthesia Preprocedure Evaluation (Signed)
Anesthesia Evaluation  Patient identified by MRN, date of birth, ID band Patient awake    Reviewed: Allergy & Precautions, NPO status , Patient's Chart, lab work & pertinent test results  Airway Mallampati: II       Dental no notable dental hx. (+) Teeth Intact, Dental Advisory Given   Pulmonary neg pulmonary ROS   Pulmonary exam normal breath sounds clear to auscultation       Cardiovascular negative cardio ROS Normal cardiovascular exam Rhythm:Regular Rate:Normal     Neuro/Psych negative neurological ROS  negative psych ROS   GI/Hepatic negative GI ROS, Neg liver ROS,,,  Endo/Other  negative endocrine ROS    Renal/GU negative Renal ROS  negative genitourinary   Musculoskeletal  (+) Arthritis , Osteoarthritis,  S/P lumbar laminectomy 2017- spinal stenosis   Abdominal   Peds  Hematology  (+) Blood dyscrasia, anemia   Anesthesia Other Findings   Reproductive/Obstetrics Uterine leiomyoma                              Anesthesia Physical Anesthesia Plan  ASA: 2  Anesthesia Plan: General   Post-op Pain Management: Dilaudid IV, Precedex and Tylenol PO (pre-op)*   Induction: Intravenous  PONV Risk Score and Plan: 4 or greater and Treatment may vary due to age or medical condition, Midazolam, Scopolamine patch - Pre-op, Dexamethasone and Ondansetron  Airway Management Planned: Oral ETT  Additional Equipment: None  Intra-op Plan:   Post-operative Plan: Extubation in OR  Informed Consent: I have reviewed the patients History and Physical, chart, labs and discussed the procedure including the risks, benefits and alternatives for the proposed anesthesia with the patient or authorized representative who has indicated his/her understanding and acceptance.     Dental advisory given  Plan Discussed with: Anesthesiologist and CRNA  Anesthesia Plan Comments:         Anesthesia  Quick Evaluation

## 2023-04-14 NOTE — Plan of Care (Signed)
CHL Tonsillectomy/Adenoidectomy, Postoperative PEDS care plan entered in error.

## 2023-04-15 ENCOUNTER — Other Ambulatory Visit: Payer: Self-pay

## 2023-04-15 ENCOUNTER — Inpatient Hospital Stay (HOSPITAL_COMMUNITY): Payer: Self-pay | Admitting: Anesthesiology

## 2023-04-15 ENCOUNTER — Inpatient Hospital Stay (HOSPITAL_COMMUNITY)
Admission: RE | Admit: 2023-04-15 | Discharge: 2023-04-17 | DRG: 743 | Disposition: A | Discharge status: Home / Self Care | Payer: BC Managed Care – PPO | Attending: Obstetrics and Gynecology | Admitting: Obstetrics and Gynecology

## 2023-04-15 ENCOUNTER — Inpatient Hospital Stay (HOSPITAL_COMMUNITY): Payer: BC Managed Care – PPO | Admitting: Anesthesiology

## 2023-04-15 ENCOUNTER — Encounter (HOSPITAL_COMMUNITY): Payer: Self-pay | Admitting: Obstetrics and Gynecology

## 2023-04-15 ENCOUNTER — Encounter (HOSPITAL_COMMUNITY)
Admission: RE | Disposition: A | Discharge status: Home / Self Care | Payer: Self-pay | Source: Home / Self Care | Attending: Obstetrics and Gynecology

## 2023-04-15 DIAGNOSIS — N921 Excessive and frequent menstruation with irregular cycle: Secondary | ICD-10-CM | POA: Diagnosis present

## 2023-04-15 DIAGNOSIS — Z8349 Family history of other endocrine, nutritional and metabolic diseases: Secondary | ICD-10-CM

## 2023-04-15 DIAGNOSIS — D259 Leiomyoma of uterus, unspecified: Secondary | ICD-10-CM | POA: Diagnosis present

## 2023-04-15 DIAGNOSIS — Z83438 Family history of other disorder of lipoprotein metabolism and other lipidemia: Secondary | ICD-10-CM | POA: Diagnosis not present

## 2023-04-15 DIAGNOSIS — Z833 Family history of diabetes mellitus: Secondary | ICD-10-CM

## 2023-04-15 DIAGNOSIS — Z90710 Acquired absence of both cervix and uterus: Secondary | ICD-10-CM | POA: Diagnosis present

## 2023-04-15 HISTORY — PX: CYSTOSCOPY: SHX5120

## 2023-04-15 HISTORY — PX: ABDOMINAL HYSTERECTOMY: SHX81

## 2023-04-15 HISTORY — DX: Anemia, unspecified: D64.9

## 2023-04-15 LAB — BASIC METABOLIC PANEL
Anion gap: 12 (ref 5–15)
BUN: 12 mg/dL (ref 6–20)
CO2: 24 mmol/L (ref 22–32)
Calcium: 9.6 mg/dL (ref 8.9–10.3)
Chloride: 102 mmol/L (ref 98–111)
Creatinine, Ser: 0.92 mg/dL (ref 0.44–1.00)
GFR, Estimated: >60 mL/min (ref >60)
Glucose, Bld: 87 mg/dL (ref 70–99)
Potassium: 4 mmol/L (ref 3.5–5.1)
Sodium: 138 mmol/L (ref 135–145)

## 2023-04-15 LAB — CBC
HCT: 42.1 % (ref 36.0–46.0)
Hemoglobin: 13.4 g/dL (ref 12.0–15.0)
MCH: 27.2 pg (ref 26.0–34.0)
MCHC: 31.8 g/dL (ref 30.0–36.0)
MCV: 85.6 fL (ref 80.0–100.0)
Platelets: 262 10*3/uL (ref 150–400)
RBC: 4.92 MIL/uL (ref 3.87–5.11)
RDW: 13.1 % (ref 11.5–15.5)
WBC: 4.3 10*3/uL (ref 4.0–10.5)
nRBC: 0 % (ref 0.0–0.2)

## 2023-04-15 LAB — POCT PREGNANCY, URINE: Preg Test, Ur: NEGATIVE

## 2023-04-15 LAB — TYPE AND SCREEN
ABO/RH(D): A POS
Antibody Screen: NEGATIVE

## 2023-04-15 SURGERY — HYSTERECTOMY, ABDOMINAL
Anesthesia: General | Site: Pelvis

## 2023-04-15 MED ORDER — METOCLOPRAMIDE HCL 5 MG/ML IJ SOLN
INTRAMUSCULAR | Status: AC
  Filled 2023-04-15: qty 2

## 2023-04-15 MED ORDER — DEXMEDETOMIDINE HCL IN NACL 80 MCG/20ML IV SOLN
INTRAVENOUS | Status: DC | PRN
  Administered 2023-04-15 (×2): 8 ug via INTRAVENOUS

## 2023-04-15 MED ORDER — ORAL CARE MOUTH RINSE: 15.0000 mL | Freq: Once | OROMUCOSAL | Status: AC

## 2023-04-15 MED ORDER — PROPOFOL 10 MG/ML IV BOLUS
INTRAVENOUS | Status: AC
  Filled 2023-04-15: qty 20

## 2023-04-15 MED ORDER — HYDROMORPHONE HCL 1 MG/ML IJ SOLN
INTRAMUSCULAR | Status: AC
  Filled 2023-04-15: qty 0.5

## 2023-04-15 MED ORDER — IBUPROFEN 600 MG PO TABS
600.0000 mg | ORAL_TABLET | Freq: Four times a day (QID) | ORAL | Status: DC
  Administered 2023-04-16 – 2023-04-17 (×4): 600 mg via ORAL
  Filled 2023-04-15 (×4): qty 1

## 2023-04-15 MED ORDER — ONDANSETRON HCL 4 MG/2ML IJ SOLN: 4.0000 mg | Freq: Four times a day (QID) | INTRAMUSCULAR | Status: DC | PRN

## 2023-04-15 MED ORDER — ONDANSETRON HCL 4 MG/2ML IJ SOLN: 4.0000 mg | Freq: Once | INTRAMUSCULAR | Status: DC | PRN

## 2023-04-15 MED ORDER — FENTANYL CITRATE (PF) 250 MCG/5ML IJ SOLN
INTRAMUSCULAR | Status: DC | PRN
  Administered 2023-04-15: 50 ug via INTRAVENOUS
  Administered 2023-04-15: 100 ug via INTRAVENOUS

## 2023-04-15 MED ORDER — KETOROLAC TROMETHAMINE 30 MG/ML IJ SOLN
30.0000 mg | Freq: Four times a day (QID) | INTRAMUSCULAR | Status: AC
  Administered 2023-04-15 – 2023-04-16 (×4): 30 mg via INTRAVENOUS
  Filled 2023-04-15 (×4): qty 1

## 2023-04-15 MED ORDER — HYDROMORPHONE HCL 1 MG/ML IJ SOLN
INTRAMUSCULAR | Status: AC
  Filled 2023-04-15: qty 1

## 2023-04-15 MED ORDER — ROCURONIUM BROMIDE 10 MG/ML (PF) SYRINGE
PREFILLED_SYRINGE | INTRAVENOUS | Status: AC
  Filled 2023-04-15: qty 10

## 2023-04-15 MED ORDER — PHENYLEPHRINE 80 MCG/ML (10ML) SYRINGE FOR IV PUSH (FOR BLOOD PRESSURE SUPPORT)
PREFILLED_SYRINGE | INTRAVENOUS | Status: AC
  Filled 2023-04-15: qty 10

## 2023-04-15 MED ORDER — DIPHENHYDRAMINE HCL 12.5 MG/5ML PO ELIX
12.5000 mg | ORAL_SOLUTION | Freq: Four times a day (QID) | ORAL | Status: DC | PRN
Start: 2023-04-15 — End: 2023-04-16

## 2023-04-15 MED ORDER — BUPIVACAINE HCL (PF) 0.25 % IJ SOLN: INTRAMUSCULAR | Status: DC | PRN

## 2023-04-15 MED ORDER — OXYCODONE HCL 5 MG/5ML PO SOLN: 5.0000 mg | Freq: Once | ORAL | Status: DC | PRN

## 2023-04-15 MED ORDER — DEXAMETHASONE SODIUM PHOSPHATE 10 MG/ML IJ SOLN
INTRAMUSCULAR | Status: DC | PRN
  Administered 2023-04-15: 10 mg via INTRAVENOUS

## 2023-04-15 MED ORDER — CHLORHEXIDINE GLUCONATE 0.12 % MT SOLN
15.0000 mL | Freq: Once | OROMUCOSAL | Status: AC
  Administered 2023-04-15: 15 mL via OROMUCOSAL
  Filled 2023-04-15: qty 15

## 2023-04-15 MED ORDER — FENTANYL CITRATE (PF) 250 MCG/5ML IJ SOLN
INTRAMUSCULAR | Status: AC
  Filled 2023-04-15: qty 5

## 2023-04-15 MED ORDER — HYDROMORPHONE HCL 1 MG/ML IJ SOLN
INTRAMUSCULAR | Status: DC | PRN
  Administered 2023-04-15: .5 mg via INTRAVENOUS

## 2023-04-15 MED ORDER — NALOXONE HCL 0.4 MG/ML IJ SOLN: 0.4000 mg | INTRAMUSCULAR | Status: DC | PRN

## 2023-04-15 MED ORDER — DIPHENHYDRAMINE HCL 50 MG/ML IJ SOLN: 12.5000 mg | Freq: Four times a day (QID) | INTRAMUSCULAR | Status: DC | PRN

## 2023-04-15 MED ORDER — SUGAMMADEX SODIUM 200 MG/2ML IV SOLN
INTRAVENOUS | Status: DC | PRN
  Administered 2023-04-15 (×2): 200 mg via INTRAVENOUS

## 2023-04-15 MED ORDER — HYDROMORPHONE HCL 1 MG/ML IJ SOLN
0.2500 mg | INTRAMUSCULAR | Status: DC | PRN
Start: 2023-04-15 — End: 2023-04-15
  Administered 2023-04-15 (×3): 0.5 mg via INTRAVENOUS

## 2023-04-15 MED ORDER — PROPOFOL 10 MG/ML IV BOLUS
INTRAVENOUS | Status: DC | PRN
  Administered 2023-04-15: 200 mg via INTRAVENOUS

## 2023-04-15 MED ORDER — ONDANSETRON HCL 4 MG/2ML IJ SOLN
INTRAMUSCULAR | Status: DC | PRN
  Administered 2023-04-15: 4 mg via INTRAVENOUS

## 2023-04-15 MED ORDER — ONDANSETRON HCL 4 MG/2ML IJ SOLN
INTRAMUSCULAR | Status: AC
  Filled 2023-04-15: qty 2

## 2023-04-15 MED ORDER — DEXAMETHASONE SODIUM PHOSPHATE 10 MG/ML IJ SOLN
INTRAMUSCULAR | Status: AC
  Filled 2023-04-15: qty 1

## 2023-04-15 MED ORDER — HYDROMORPHONE 1 MG/ML IV SOLN
INTRAVENOUS | Status: DC
  Administered 2023-04-15: 1 mg via INTRAVENOUS
  Administered 2023-04-15: 30 mg via INTRAVENOUS
  Administered 2023-04-16: 0.2 mg via INTRAVENOUS
  Administered 2023-04-16: 0.4 mg via INTRAVENOUS
  Administered 2023-04-16: 0.2 mg via INTRAVENOUS

## 2023-04-15 MED ORDER — LIDOCAINE 2% (20 MG/ML) 5 ML SYRINGE
INTRAMUSCULAR | Status: AC
  Filled 2023-04-15: qty 5

## 2023-04-15 MED ORDER — DROPERIDOL 2.5 MG/ML IJ SOLN
0.6250 mg | Freq: Once | INTRAMUSCULAR | Status: AC | PRN
  Administered 2023-04-15: 0.625 mg via INTRAVENOUS

## 2023-04-15 MED ORDER — SCOPOLAMINE 1 MG/3DAYS TD PT72
1.0000 | MEDICATED_PATCH | TRANSDERMAL | Status: DC
  Administered 2023-04-15: 1.5 mg via TRANSDERMAL
  Filled 2023-04-15: qty 1

## 2023-04-15 MED ORDER — POVIDONE-IODINE 10 % EX SWAB
2.0000 | Freq: Once | CUTANEOUS | Status: AC
  Administered 2023-04-15: 2 via TOPICAL

## 2023-04-15 MED ORDER — ROCURONIUM BROMIDE 10 MG/ML (PF) SYRINGE
PREFILLED_SYRINGE | INTRAVENOUS | Status: DC | PRN
  Administered 2023-04-15: 60 mg via INTRAVENOUS
  Administered 2023-04-15 (×2): 10 mg via INTRAVENOUS
  Administered 2023-04-15 (×2): 20 mg via INTRAVENOUS

## 2023-04-15 MED ORDER — OXYCODONE HCL 5 MG PO TABS
5.0000 mg | ORAL_TABLET | ORAL | Status: DC | PRN
  Administered 2023-04-16 (×2): 5 mg via ORAL
  Filled 2023-04-15 (×2): qty 1

## 2023-04-15 MED ORDER — SODIUM CHLORIDE 0.9% FLUSH: 9.0000 mL | INTRAVENOUS | Status: DC | PRN

## 2023-04-15 MED ORDER — OXYCODONE HCL 5 MG PO TABS: 5.0000 mg | ORAL_TABLET | Freq: Once | ORAL | Status: DC | PRN

## 2023-04-15 MED ORDER — ESMOLOL HCL 100 MG/10ML IV SOLN
INTRAVENOUS | Status: DC | PRN
  Administered 2023-04-15: 20 mg via INTRAVENOUS

## 2023-04-15 MED ORDER — LACTATED RINGERS IV SOLN: INTRAVENOUS | Status: DC

## 2023-04-15 MED ORDER — HYDROMORPHONE 1 MG/ML IV SOLN
INTRAVENOUS | Status: AC
  Filled 2023-04-15: qty 30

## 2023-04-15 MED ORDER — LIDOCAINE 2% (20 MG/ML) 5 ML SYRINGE
INTRAMUSCULAR | Status: DC | PRN
  Administered 2023-04-15: 80 mg via INTRAVENOUS

## 2023-04-15 MED ORDER — METOCLOPRAMIDE HCL 5 MG/ML IJ SOLN
10.0000 mg | Freq: Once | INTRAMUSCULAR | Status: AC
Start: 2023-04-15 — End: 2023-04-15
  Administered 2023-04-15: 10 mg via INTRAVENOUS

## 2023-04-15 MED ORDER — DROPERIDOL 2.5 MG/ML IJ SOLN
INTRAMUSCULAR | Status: AC
  Filled 2023-04-15: qty 2

## 2023-04-15 MED ORDER — LACTATED RINGERS IV SOLN: INTRAVENOUS | Status: AC

## 2023-04-15 MED ORDER — 0.9 % SODIUM CHLORIDE (POUR BTL) OPTIME
TOPICAL | Status: DC | PRN
  Administered 2023-04-15 (×2): 1000 mL

## 2023-04-15 MED ORDER — ACETAMINOPHEN 500 MG PO TABS
1000.0000 mg | ORAL_TABLET | Freq: Four times a day (QID) | ORAL | Status: DC
Start: 2023-04-15 — End: 2023-04-17
  Administered 2023-04-16 – 2023-04-17 (×5): 1000 mg via ORAL
  Filled 2023-04-15 (×6): qty 2

## 2023-04-15 MED ORDER — PHENYLEPHRINE 80 MCG/ML (10ML) SYRINGE FOR IV PUSH (FOR BLOOD PRESSURE SUPPORT)
PREFILLED_SYRINGE | INTRAVENOUS | Status: DC | PRN
  Administered 2023-04-15 (×2): 80 ug via INTRAVENOUS

## 2023-04-15 MED ORDER — CEFAZOLIN SODIUM-DEXTROSE 2-4 GM/100ML-% IV SOLN
2.0000 g | INTRAVENOUS | Status: AC
  Administered 2023-04-15: 2 g via INTRAVENOUS
  Filled 2023-04-15: qty 100

## 2023-04-15 SURGICAL SUPPLY — 55 items
APL SKNCLS STERI-STRIP NONHPOA (GAUZE/BANDAGES/DRESSINGS) ×2
BAG COUNTER SPONGE SURGICOUNT (BAG) ×3 IMPLANT
BAG SPNG CNTER NS LX DISP (BAG) ×2
BARRIER ADHS 3X4 INTERCEED (GAUZE/BANDAGES/DRESSINGS) IMPLANT
BENZOIN TINCTURE PRP APPL 2/3 (GAUZE/BANDAGES/DRESSINGS) ×3 IMPLANT
BRR ADH 4X3 ABS CNTRL BYND (GAUZE/BANDAGES/DRESSINGS) ×2
DRAPE WARM FLUID 44X44 (DRAPES) IMPLANT
DRSG OPSITE POSTOP 4X10 (GAUZE/BANDAGES/DRESSINGS) ×3 IMPLANT
DURAPREP 26ML APPLICATOR (WOUND CARE) ×3 IMPLANT
GAUZE 4X4 16PLY ~~LOC~~+RFID DBL (SPONGE) ×6 IMPLANT
GLOVE BIO SURGEON STRL SZ 6.5 (GLOVE) IMPLANT
GLOVE BIO SURGEON STRL SZ7.5 (GLOVE) ×3 IMPLANT
GLOVE BIOGEL PI IND STRL 6.5 (GLOVE) IMPLANT
GLOVE BIOGEL PI IND STRL 7.5 (GLOVE) ×3 IMPLANT
GOWN STRL REUS W/ TWL LRG LVL3 (GOWN DISPOSABLE) ×9 IMPLANT
GOWN STRL REUS W/TWL LRG LVL3 (GOWN DISPOSABLE) ×8
HEMOSTAT ARISTA ABSORB 3G PWDR (HEMOSTASIS) IMPLANT
HEMOSTAT SURGICEL 4X8 (HEMOSTASIS) IMPLANT
HIBICLENS CHG 4% 4OZ BTL (MISCELLANEOUS) ×6 IMPLANT
KIT PINK PAD W/HEAD ARE REST (MISCELLANEOUS)
KIT PINK PAD W/HEAD ARM REST (MISCELLANEOUS) ×3 IMPLANT
KIT TURNOVER KIT B (KITS) ×3 IMPLANT
MANIFOLD NEPTUNE II (INSTRUMENTS) ×3 IMPLANT
NDL HYPO 22X1.5 SAFETY MO (MISCELLANEOUS) IMPLANT
NEEDLE HYPO 22X1.5 SAFETY MO (MISCELLANEOUS) ×2
NS IRRIG 1000ML POUR BTL (IV SOLUTION) ×3 IMPLANT
PACK ABDOMINAL GYN (CUSTOM PROCEDURE TRAY) ×3 IMPLANT
PAD ARMBOARD 7.5X6 YLW CONV (MISCELLANEOUS) ×3 IMPLANT
PAD OB MATERNITY 4.3X12.25 (PERSONAL CARE ITEMS) ×3 IMPLANT
RTRCTR C-SECT PINK 25CM LRG (MISCELLANEOUS) IMPLANT
SET CYSTO W/LG BORE CLAMP LF (SET/KITS/TRAYS/PACK) IMPLANT
SHEET LAVH (DRAPES) ×3 IMPLANT
SPIKE FLUID TRANSFER (MISCELLANEOUS) ×3 IMPLANT
SPONGE INTESTINAL PEANUT (DISPOSABLE) IMPLANT
SPONGE T-LAP 18X18 ~~LOC~~+RFID (SPONGE) IMPLANT
STRIP CLOSURE SKIN 1/2X4 (GAUZE/BANDAGES/DRESSINGS) ×3 IMPLANT
SURGILUBE 2OZ TUBE FLIPTOP (MISCELLANEOUS) IMPLANT
SUT CHROMIC 2 0 CT 1 (SUTURE) ×3 IMPLANT
SUT CHROMIC 2 0 SH (SUTURE) IMPLANT
SUT MNCRL AB 3-0 PS2 27 (SUTURE) ×3 IMPLANT
SUT MON AB 3-0 SH 27 (SUTURE)
SUT MON AB 3-0 SH27 (SUTURE) IMPLANT
SUT PDS AB 1 CTX 36 (SUTURE) IMPLANT
SUT PLAIN 2 0 XLH (SUTURE) ×3 IMPLANT
SUT VIC AB 0 CT1 18XCR BRD8 (SUTURE) ×6 IMPLANT
SUT VIC AB 0 CT1 27 (SUTURE) ×4
SUT VIC AB 0 CT1 27XBRD ANBCTR (SUTURE) ×6 IMPLANT
SUT VIC AB 0 CT1 8-18 (SUTURE) ×6
SUT VIC AB 3-0 SH 27 (SUTURE) ×6
SUT VIC AB 3-0 SH 27X BRD (SUTURE) IMPLANT
SUT VIC AB 4-0 PS2 18 (SUTURE) IMPLANT
SUT VICRYL 0 TIES 12 18 (SUTURE) ×3 IMPLANT
SYR CONTROL 10ML LL (SYRINGE) ×3 IMPLANT
TOWEL GREEN STERILE FF (TOWEL DISPOSABLE) ×6 IMPLANT
TRAY FOLEY W/BAG SLVR 14FR (SET/KITS/TRAYS/PACK) ×3 IMPLANT

## 2023-04-15 NOTE — Transfer of Care (Signed)
Immediate Anesthesia Transfer of Care Note  Patient: Kelly Sharp  Procedure(s) Performed: ABDOMINAL HYSTERECTOMY WITH BILATERAL SALPINGECTOMY (Pelvis) CYSTOSCOPY (Bladder)  Patient Location: PACU  Anesthesia Type:General  Level of Consciousness: awake, alert , and oriented  Airway & Oxygen Therapy: Patient Spontanous Breathing and Patient connected to face mask oxygen  Post-op Assessment: Report given to RN and Post -op Vital signs reviewed and stable  Post vital signs: Reviewed and stable  Last Vitals:  Vitals Value Taken Time  BP 150/119 04/15/23 1430  Temp    Pulse    Resp 11 04/15/23 1430  SpO2    Vitals shown include unfiled device data.  Last Pain:  Vitals:   04/15/23 0659  TempSrc:   PainSc: 0-No pain         Complications: No notable events documented.

## 2023-04-15 NOTE — Anesthesia Postprocedure Evaluation (Signed)
Anesthesia Post Note  Patient: Kelly Sharp  Procedure(s) Performed: ABDOMINAL HYSTERECTOMY WITH BILATERAL SALPINGECTOMY (Pelvis) CYSTOSCOPY (Bladder)     Patient location during evaluation: PACU Anesthesia Type: General Level of consciousness: awake and alert and oriented Pain management: pain level controlled Vital Signs Assessment: post-procedure vital signs reviewed and stable Respiratory status: spontaneous breathing, nonlabored ventilation and respiratory function stable Cardiovascular status: blood pressure returned to baseline and stable Postop Assessment: no apparent nausea or vomiting Anesthetic complications: no   There were no known notable events for this encounter.  Last Vitals:  Vitals:   04/15/23 1515 04/15/23 1526  BP: (!) 157/86   Pulse: 86   Resp: 12 14  Temp:    SpO2: 94% 98%    Last Pain:  Vitals:   04/15/23 1526  TempSrc:   PainSc: 6                  Brieanne Mignone A.

## 2023-04-15 NOTE — Op Note (Signed)
Preop Diagnosis: Uterine leiomyoma   Postop Diagnosis: Uterine leiomyoma   Procedure: 1.TOTAL ABDOMINAL HYSTERECTOMY 2.BILATERAL SALPINGECTOMY 3.CYSTOSCOPY   Anesthesia: General   Anesthesiologist: See Anesthesia Record  Attending: Osborn Coho, MD   Assistant: Jaymes Graff, MD  Findings: Large Fibroid Uterus  Pathology: Uterus with fibroids, Cervix, bilateral fallopian tubes   Fluids: See flowsheet  UOP: See flowsheet  EBL: See Flowsheet  Complications: None  Procedure: The patient received intravenous antibiotics and had sequential compression devices applied to her lower extremities while in the preoperative area.   She was taken to the operating room and placed under general anesthesia without difficulty.The abdomen and perineum were prepped and draped in a sterile manner, and she was placed in a dorsal supine position.  A Foley catheter was inserted into the bladder and attached to constant drainage. After an adequate timeout was performed, a Pfannensteil skin incision was made. This incision was taken down to the fascia using electrocautery with care given to maintain good hemostasis. The fascia was incised in the midline and the fascial incision was then extended bilaterally using electrocautery without difficulty. The fascia was then dissected off the underlying rectus muscles using blunt and sharp dissection. The rectus muscles were split bluntly in the midline and the peritoneum entered sharply without complication. This peritoneal incision was then extended superiorly and inferiorly with care given to prevent bowel or bladder injury. Attention was then turned to the pelvis. An Alexis retractor was placed into the incision, and the bowel was packed away with moist laparotomy sponges. The uterus at this point was noted to be mobilized and was delivered up out of the abdomen.  The bowel was packed away with moist laparotomy sponges. The round ligaments on each side were  clamped, suture ligated with 0 Vicryl, and transected with electrocautery allowing entry into the broad ligament. Of note, all sutures used in this procedure are 0 Vicryl unless otherwise noted.   The left fallopian tube was elevated with a babcock clamped immediately beneath it at the mesosalpinx, cut and suture ligated.  The same was done on the contralateral side.  A hole was created in the clear portion of the posterior broad ligament and theutero-ovarian ligament was clamped on the patient's left side, cut, and doubly suture ligated with good hemostasis.  This procedure was repeated in an identical fashion on the opposite side.  A bladder flap was then created.  The bladder was then bluntly dissected off the lower uterine segment and cervix with good hemostasis noted. The uterine arteries were then skeletonized bilaterally and then clamped, cut, and doubly suture ligated with care given to prevent ureteral injury.  The uterus was then amputated across the lower uterine segment.  The uterosacral ligaments were then clamped, cut, and ligated bilaterally.  Finally, the cardinal ligaments were clamped, cut, and suture ligated bilaterally.  Acutely curved clamps were placed across the vagina just under the cervix, and the specimen was amputated and sent to pathology. The vaginal cuff angles were closed with Heaney stiches with care given to incorporate the uterosacral-cardinal ligament pedicles on both sides. The middle of the vaginal cuff was closed with a series of interrupted figure-of-eight sutures with care given to incorporate the anterior pubocervical fascia and the posterior rectovaginal fascia.   The pelvis was irrigated and hemostasis was reconfirmed at all pedicles and along the pelvic sidewall.   Interceed was applied to the cuff.  All laparotomy sponges and instruments were removed from the abdomen. The peritoneum was closed  with a running stitch of 2-0 chromic, and the fascia was closed in a running  fashion 0 vicryl. The subcutaneous layer was made hemostatic. The skin was closed with a 3-0 monocryl via a subcuticular stitch. Benzoin and steristrips were applied along the length of the incision.  Cystoscopy was performed and efflux was noted from both ureters.  Sponge, lap, needle, and instrument counts were correct times two. The patient was taken to the recovery area awake, extubated and in stable condition.  I was present and scrubbed and the assistant was required due to complexity of anatomy.

## 2023-04-15 NOTE — Anesthesia Procedure Notes (Signed)
Procedure Name: Intubation Date/Time: 04/15/2023 11:02 AM  Performed by: Allyn Kenner, CRNAPre-anesthesia Checklist: Patient identified, Emergency Drugs available, Suction available and Patient being monitored Patient Re-evaluated:Patient Re-evaluated prior to induction Oxygen Delivery Method: Circle System Utilized Preoxygenation: Pre-oxygenation with 100% oxygen Induction Type: IV induction Ventilation: Mask ventilation without difficulty Laryngoscope Size: Mac and 3 Grade View: Grade I Tube type: Oral Tube size: 7.0 mm Number of attempts: 1 Airway Equipment and Method: Stylet and Oral airway Placement Confirmation: ETT inserted through vocal cords under direct vision, positive ETCO2 and breath sounds checked- equal and bilateral Secured at: 21 cm Tube secured with: Tape Dental Injury: Teeth and Oropharynx as per pre-operative assessment

## 2023-04-15 NOTE — H&P (Addendum)
Kelly Sharp is an 47 y.o. female. Pt known to me and followed in the office presenting today for hysterectomy after failed endometrial ablation.  Pertinent Gynecological History: Menses:  flow is heavy and prolonged Bleeding: dysfunctional uterine bleeding Previous GYN Procedures:  failed endometrial ablation could not pass integrity test d/t dilated cervix   Last mammogram: normal  Last pap: normal OB History: G2, P2002   Menstrual History: No LMP recorded.  Bleeding now    Past Medical History:  Diagnosis Date   Allergy    Anemia    Arthritis    L knee   Conjunctivitis    reported by patient at preop on 06/14/16  patient reports approx 05/30/2016    Mitral valve prolapse    ? as a teenager    Past Surgical History:  Procedure Laterality Date   DECOMPRESSIVE LUMBAR LAMINECTOMY LEVEL 1 Right 06/20/2016   Procedure: MICRO LUMBAR DECOMPRESSION L5-S1 RIGHT DECOMPRESSIVE LUMBAR LAMINECTOMY LEVEL 1;  Surgeon: Jene Every, MD;  Location: [facility];  Service: Orthopedics;  Laterality: Right;  Requests 2 hours   IR RADIOLOGIST EVAL & MGMT  08/14/2022   NO PAST SURGERIES      Family History  Problem Relation Age of Onset   Thyroid disease Mother    Diabetes Father    Hyperlipidemia Father    Diabetes Maternal Grandmother    Diabetes Paternal Grandmother     Social History:  reports that she has never smoked. She has never used smokeless tobacco. She reports current alcohol use. She reports that she does not use drugs.  Allergies: No Known Allergies  Medications Prior to Admission  Medication Sig Dispense Refill Last Dose   Cholecalciferol (VITAMIN D3) 250 MCG (10000 UT) TABS Take 10,000 mg by mouth daily.   Past Week   Multiple Vitamin (MULTIVITAMIN WITH MINERALS) TABS tablet Take 2 tablets by mouth at bedtime. May resume 5 days post-op - X Factor Plus (Patient taking differently: Take 2 tablets by mouth at bedtime. X Factor plus)   Past Week   norethindrone (AYGESTIN) 5 MG  tablet Take 10 mg by mouth daily.   04/13/2023   OVER THE COUNTER MEDICATION Take 1 tablet by mouth 2 (two) times daily. Vivisal Hair growth supplement      Probiotic Product (PROBIOTIC PO) Take 2 capsules by mouth at bedtime. Plexus ProBio 5   Past Week   Zinc 30 MG CAPS Take 30 mg by mouth daily.   Past Week   Safety Seal Miscellaneous MISC 1 Application by Does not apply route daily. MEDICATION NAME: AA GEL 30 g 4     Review of Systems Denies F/C/N/V/D  Blood pressure (!) 147/93, pulse 91, temperature 97.9 F (36.6 C), temperature source Oral, resp. rate 20, height 5\' 4"  (1.626 m), weight 78.9 kg, SpO2 95%. Physical Exam Lungs unlabored breathing CV RRR Abdomen soft, NT, fundus above umbilicus Extremities no calf tenderness  Results for orders placed or performed during the hospital encounter of 04/15/23 (from the past 24 hour(s))  Pregnancy, urine POC     Status: None   Collection Time: 04/15/23  6:52 AM  Result Value Ref Range   Preg Test, Ur NEGATIVE NEGATIVE  Type and screen     Status: None   Collection Time: 04/15/23  7:15 AM  Result Value Ref Range   ABO/RH(D) A POS    Antibody Screen NEG    Sample Expiration      04/18/2023,2359 Performed at [facility], [address] [place], [place] [address]   Basic metabolic panel     Status: None   Collection Time: 04/15/23  7:28 AM  Result Value Ref Range   Sodium 138 135 - 145 mmol/L   Potassium 4.0 3.5 - 5.1 mmol/L   Chloride 102 98 - 111 mmol/L   CO2 24 22 - 32 mmol/L   Glucose, Bld 87 70 - 99 mg/dL   BUN 12 6 - 20 mg/dL   Creatinine, Ser 6.04 0.44 - 1.00 mg/dL   Calcium 9.6 8.9 - 54.0 mg/dL   GFR, Estimated >98 >11 mL/min   Anion gap 12 5 - 15  CBC     Status: None   Collection Time: 04/15/23  7:28 AM  Result Value Ref Range   WBC 4.3 4.0 - 10.5 K/uL   RBC 4.92 3.87 - 5.11 MIL/uL   Hemoglobin 13.4 12.0 - 15.0 g/dL   HCT 91.4 78.2 - 95.6 %   MCV 85.6 80.0 - 100.0 fL   MCH 27.2 26.0 - 34.0 pg   MCHC  31.8 30.0 - 36.0 g/dL   RDW 21.3 08.6 - 57.8 %   Platelets 262 150 - 400 K/uL   nRBC 0.0 0.0 - 0.2 %    No results found.  Assessment/Plan: 47 yo P2 with menometrorrhagia d/t symptomatic fibroids s/p failed endometrial ablation with inadequate relief with hormonal management presenting today for hysterectomy and removal of bilateral fallopian tubes.  Risks benefits and alternatives have been discussed with the patient including but not limited to bleeding infection and injury.  Questions answered and consent signed and witnessed.   Purcell Nails 04/15/2023, 9:06 AM

## 2023-04-16 ENCOUNTER — Encounter (HOSPITAL_COMMUNITY): Payer: Self-pay | Admitting: Obstetrics and Gynecology

## 2023-04-16 LAB — BASIC METABOLIC PANEL
Anion gap: 12 (ref 5–15)
BUN: 15 mg/dL (ref 6–20)
CO2: 23 mmol/L (ref 22–32)
Calcium: 8.6 mg/dL — ABNORMAL LOW (ref 8.9–10.3)
Chloride: 101 mmol/L (ref 98–111)
Creatinine, Ser: 1.11 mg/dL — ABNORMAL HIGH (ref 0.44–1.00)
GFR, Estimated: >60 mL/min (ref >60)
Glucose, Bld: 120 mg/dL — ABNORMAL HIGH (ref 70–99)
Potassium: 4.5 mmol/L (ref 3.5–5.1)
Sodium: 136 mmol/L (ref 135–145)

## 2023-04-16 LAB — CBC
HCT: 35.2 % — ABNORMAL LOW (ref 36.0–46.0)
Hemoglobin: 11.4 g/dL — ABNORMAL LOW (ref 12.0–15.0)
MCH: 27.5 pg (ref 26.0–34.0)
MCHC: 32.4 g/dL (ref 30.0–36.0)
MCV: 84.8 fL (ref 80.0–100.0)
Platelets: 247 10*3/uL (ref 150–400)
RBC: 4.15 MIL/uL (ref 3.87–5.11)
RDW: 13 % (ref 11.5–15.5)
WBC: 10.2 10*3/uL (ref 4.0–10.5)
nRBC: 0 % (ref 0.0–0.2)

## 2023-04-16 MED ORDER — SIMETHICONE 80 MG PO CHEW
80.0000 mg | CHEWABLE_TABLET | Freq: Four times a day (QID) | ORAL | Status: DC | PRN
  Administered 2023-04-16: 80 mg via ORAL
  Filled 2023-04-16: qty 1

## 2023-04-16 MED ORDER — LACTATED RINGERS IV BOLUS
250.0000 mL | Freq: Once | INTRAVENOUS | Status: AC
  Administered 2023-04-16: 250 mL via INTRAVENOUS

## 2023-04-17 LAB — BASIC METABOLIC PANEL
Anion gap: 7 (ref 5–15)
BUN: 13 mg/dL (ref 6–20)
CO2: 25 mmol/L (ref 22–32)
Calcium: 8.1 mg/dL — ABNORMAL LOW (ref 8.9–10.3)
Chloride: 104 mmol/L (ref 98–111)
Creatinine, Ser: 0.99 mg/dL (ref 0.44–1.00)
GFR, Estimated: >60 mL/min (ref >60)
Glucose, Bld: 90 mg/dL (ref 70–99)
Potassium: 4.4 mmol/L (ref 3.5–5.1)
Sodium: 136 mmol/L (ref 135–145)

## 2023-04-17 LAB — SURGICAL PATHOLOGY

## 2023-04-17 MED ORDER — OXYCODONE HCL 5 MG PO TABS: 5.0000 mg | ORAL_TABLET | Freq: Four times a day (QID) | ORAL | 0 refills | Status: AC | PRN

## 2023-04-17 MED ORDER — IBUPROFEN 600 MG PO TABS: 600.0000 mg | ORAL_TABLET | Freq: Four times a day (QID) | ORAL | 1 refills | Status: AC | PRN

## 2023-04-17 MED ORDER — ADULT MULTIVITAMIN W/MINERALS CH: 1.0000 | ORAL_TABLET | Freq: Every day | ORAL | Status: AC

## 2023-04-17 MED ORDER — VITAMIN D3 250 MCG (10000 UT) PO TABS: 2000.0000 [IU]/d | ORAL_TABLET | Freq: Every day | ORAL | Status: AC

## 2023-04-17 NOTE — Plan of Care (Signed)
Problem: Education: Goal: Knowledge of General Education information will improve Description: Including pain rating scale, medication(s)/side effects and non-pharmacologic comfort measures Outcome: Adequate for Discharge   Problem: Health Behavior/Discharge Planning: Goal: Ability to manage health-related needs will improve Outcome: Adequate for Discharge   Problem: Clinical Measurements: Goal: Ability to maintain clinical measurements within normal limits will improve Outcome: Adequate for Discharge Goal: Will remain free from infection Outcome: Adequate for Discharge Goal: Diagnostic test results will improve Outcome: Adequate for Discharge Goal: Respiratory complications will improve Outcome: Adequate for Discharge Goal: Cardiovascular complication will be avoided Outcome: Adequate for Discharge   Problem: Activity: Goal: Risk for activity intolerance will decrease Outcome: Adequate for Discharge   Problem: Nutrition: Goal: Adequate nutrition will be maintained Outcome: Adequate for Discharge   Problem: Coping: Goal: Level of anxiety will decrease Outcome: Adequate for Discharge   Problem: Elimination: Goal: Will not experience complications related to bowel motility Outcome: Adequate for Discharge Goal: Will not experience complications related to urinary retention Outcome: Adequate for Discharge   Problem: Pain Managment: Goal: General experience of comfort will improve Outcome: Adequate for Discharge   Problem: Safety: Goal: Ability to remain free from injury will improve Outcome: Adequate for Discharge   Problem: Skin Integrity: Goal: Risk for impaired skin integrity will decrease Outcome: Adequate for Discharge   Problem: Education: Goal: Knowledge of the prescribed therapeutic regimen will improve Outcome: Adequate for Discharge Goal: Understanding of sexual limitations or changes related to disease process or condition will improve Outcome: Adequate  for Discharge Goal: Individualized Educational Video(s) Outcome: Adequate for Discharge   Problem: Self-Concept: Goal: Communication of feelings regarding changes in body function or appearance will improve Outcome: Adequate for Discharge   Problem: Skin Integrity: Goal: Demonstration of wound healing without infection will improve Outcome: Adequate for Discharge

## 2023-04-17 NOTE — Discharge Summary (Signed)
Physician Discharge Summary  Patient ID: Kelly Sharp MRN: [id number] DOB/AGE: [birth year] 45 y.o.  Admit date: 04/15/2023 Discharge date: 04/17/2023  Admission Diagnoses: Symptomatic fibroids  Discharge Diagnoses:  Principal Problem:   Uterine leiomyoma Active Problems:   S/P total abdominal hysterectomy   Discharged Condition: good  Hospital Course: Admitted for TAH/BS with uneventful post op course.  Temp 99.1 day of discharge so instructions given for IS and to check temps BID and f/u in office in 1wk.  Discharge instructions reviewed.  Consults: None  Significant Diagnostic Studies: Hgb 11.3, creatinine 1.1 to 0.9 after hydration  Treatments: routine post op care  Discharge Exam: Blood pressure 125/73, pulse 64, temperature 99.2 F (37.3 C), temperature source Oral, resp. rate 18, height 5\' 4"  (1.626 m), weight 78.9 kg, SpO2 99%. General appearance: alert, cooperative, and no distress Resp: clear to auscultation bilaterally Cardio: regular rate and rhythm GI: soft, app tender, ND, NABS Extremities: no calf tenderness Incision/Wound: honeycomb dressing in place and c/d  Disposition: Discharge disposition: 01-Home or Self Care        Allergies as of 04/17/2023   No Known Allergies      Medication List     STOP taking these medications    norethindrone 5 MG tablet Commonly known as: AYGESTIN       TAKE these medications    ibuprofen 600 MG tablet Commonly known as: ADVIL Take 1 tablet (600 mg total) by mouth every 6 (six) hours as needed.   multivitamin with minerals Tabs tablet Take 1 tablet by mouth at bedtime. May resume 5 days post-op - X Factor Plus What changed: how much to take   OVER THE COUNTER MEDICATION Take 1 tablet by mouth 2 (two) times daily. Vivisal Hair growth supplement   oxyCODONE 5 MG immediate release tablet Commonly known as: Oxy IR/ROXICODONE Take 1 tablet (5 mg total) by mouth every 6 (six) hours as needed for  moderate pain (pain score 4-6), severe pain (pain score 7-10) or breakthrough pain.   PROBIOTIC PO Take 2 capsules by mouth at bedtime. Plexus ProBio 5   Safety Seal Miscellaneous Misc 1 Application by Does not apply route daily. MEDICATION NAME: AA GEL   Vitamin D3 250 MCG (10000 UT) Tabs Take 2,000 Int'l Units/day by mouth daily. What changed: how much to take   Zinc 30 MG Caps Take 30 mg by mouth daily.        Follow-up Information     Osborn Coho, MD. Schedule an appointment as soon as possible for a visit in 1 week(s).   Specialty: Obstetrics and Gynecology Why: for post op check Contact information: [address] [place] [address] [phone]         Osborn Coho, MD. Schedule an appointment as soon as possible for a visit in 6 week(s).   Specialty: Obstetrics and Gynecology Why: for post op appointment Contact information: [address] [place] [address] [phone]                 Signed: Purcell Nails 04/17/2023, 1:37 PM

## 2023-04-17 NOTE — Progress Notes (Signed)
2 Days Post-Op Procedure(s) (LRB): ABDOMINAL HYSTERECTOMY WITH BILATERAL SALPINGECTOMY (N/A) CYSTOSCOPY  Subjective: Patient reports tolerating PO, + flatus, and no problems voiding.    Objective: I have reviewed patient's vital signs and intake and output.  General: alert, cooperative, and no distress Resp: clear to auscultation bilaterally Cardio: regular rate and rhythm Extremities: no calf tenderness Minimal spotting with wiping, no active bleeding  Assessment: s/p Procedure(s): ABDOMINAL HYSTERECTOMY WITH BILATERAL SALPINGECTOMY (N/A) CYSTOSCOPY: stable, progressing well, and tolerating diet  Plan: Advance diet Encourage ambulation Cont strict Is/Os Cont hydration and recheck creatinine for creatinine slightly elevated at 1.11 SCDs for DVT prophylaxis Anticipate discharge tomorrow   LOS: 2 days    Purcell Nails, MD 04/17/2023, 1:40 PM

## 2023-07-09 ENCOUNTER — Ambulatory Visit: Payer: 59 | Admitting: Dermatology

## 2023-07-09 ENCOUNTER — Encounter: Payer: Self-pay | Admitting: Dermatology

## 2023-07-09 DIAGNOSIS — L6681 Central centrifugal cicatricial alopecia: Secondary | ICD-10-CM | POA: Diagnosis not present

## 2023-07-09 DIAGNOSIS — L649 Androgenic alopecia, unspecified: Secondary | ICD-10-CM | POA: Diagnosis not present

## 2023-07-09 NOTE — Patient Instructions (Addendum)
 Hello Lela,  Thank you for visiting my office today. Your dedication to improving your health is commendable, and it's a pleasure to witness the progress in your treatment. Here is a summary of the key instructions from today's consultation:  Clobetasol and Betamethasone: Continue using this medication from [facility] every morning.  Viviscal: Continue taking this supplement twice daily.  Vital Protein Collagen: Continue with this as prescribed.  Application Caution: Apply the treatment carefully in the morning to avoid hair growth in unintended areas.  Follow-Up Appointment: Schedule your next follow-up appointment in six months. Preferably on a Tuesday or Thursday, avoiding the 4th of July week.  Medication Refills: If you need medication refills, please send a message via MyChart.  It's encouraging to see the regrowth, especially the baby hairs. I look forward to seeing further improvements at your next visit. Stay warm and take care.  Best regards,  Dr. Delon Lenis D[facility]at[facility]  Important Information  Due to recent changes in healthcare laws, you may see results of your pathology and/or laboratory studies on MyChart before the doctors have had a chance to review them. We understand that in some cases there may be results that are confusing or concerning to you. Please understand that not all results are received at the same time and often the doctors may need to interpret multiple results in order to provide you with the best plan of care or course of treatment. Therefore, we ask that you please give us  2 business days to thoroughly review all your results before contacting the office for clarification. Should we see a critical lab result, you will be contacted sooner.   If You Need Anything After Your Visit  If you have any questions or concerns for your doctor, please call our main line at [phone] If no one answers, please leave a voicemail as directed and we will  return your call as soon as possible. Messages left after 4 pm will be answered the following business day.   You may also send us  a message via MyChart. We typically respond to MyChart messages within 1-2 business days.  For prescription refills, please ask your pharmacy to contact our office. Our fax number is [phone].  If you have an urgent issue when the clinic is closed that cannot wait until the next business day, you can page your doctor at the number below.    Please note that while we do our best to be available for urgent issues outside of office hours, we are not available 24/7.   If you have an urgent issue and are unable to reach us , you may choose to seek medical care at your doctor's office, retail clinic, urgent care center, or emergency room.  If you have a medical emergency, please immediately call 911 or go to the emergency department. In the event of inclement weather, please call our main line at [phone] for an update on the status of any delays or closures.  Dermatology Medication Tips: Please keep the boxes that topical medications come in in order to help keep track of the instructions about where and how to use these. Pharmacies typically print the medication instructions only on the boxes and not directly on the medication tubes.   If your medication is too expensive, please contact our office at [phone] or send us  a message through MyChart.   We are unable to tell what your co-pay for medications will be in advance as this is different depending on your insurance coverage.  However, we may be able to find a substitute medication at lower cost or fill out paperwork to get insurance to cover a needed medication.   If a prior authorization is required to get your medication covered by your insurance company, please allow us  1-2 business days to complete this process.  Drug prices often vary depending on where the prescription is filled and some pharmacies  may offer cheaper prices.  The website www.goodrx.com contains coupons for medications through different pharmacies. The prices here do not account for what the cost may be with help from insurance (it may be cheaper with your insurance), but the website can give you the price if you did not use any insurance.  - You can print the associated coupon and take it with your prescription to the pharmacy.  - You may also stop by our office during regular business hours and pick up a GoodRx coupon card.  - If you need your prescription sent electronically to a different pharmacy, notify our office through [facility] or by phone at [phone]

## 2023-07-09 NOTE — Progress Notes (Signed)
   Follow-Up Visit   Subjective  Kelly Sharp is a 48 y.o. female who presents for the following: CCCA  Patient present today for follow up visit. Patient was last evaluated on 04/08/23. Patient reports sxs are better. Patient denies medication changes. She has been using the OTC supplements Viviscal and Vital Proteins and using the [employer] clobetasol/minoxidil compound. However, she had to wait 2 weeks prior to starting treatment for CCCA as she was schedule for a hysterectomy and was not able to take it.   The following portions of the chart were reviewed this encounter and updated as appropriate: medications, allergies, medical history  Review of Systems:  No other skin or systemic complaints except as noted in HPI or Assessment and Plan.  Objective  Well appearing patient in no apparent distress; mood and affect are within normal limits.   A focused examination was performed of the following areas: scalp   Relevant exam findings are noted in the Assessment and Plan.           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 Notable improvement in hair growth on the frontal scalp, temples, and vertex after 3 months of treatment, including the regrowth of baby hairs. Recent hysterectomy (ovaries preserved) for fibroid-related issues may potentially accelerate androgenetic alopecia. Plan:   Continue Clobetasol and betamethasone from [facility] every morning.   Continue Viviscal twice daily.   Continue Vital Protein Collagen.   Extend treatment plan to 6 months.   Caution advised when applying morning treatment to avoid unintended hair growth areas.   Follow-up appointment in 6 months, avoiding 4th of July week.   Patient instructed to send MyChart message for refills if needed.      No follow-ups on  file.    Documentation: I have reviewed the above documentation for accuracy and completeness, and I agree with the above.   I, Shirron Maranda, CMA, am acting as scribe for [employer], DO.   Delon Lenis, DO

## 2024-01-07 ENCOUNTER — Encounter: Payer: Self-pay | Admitting: Dermatology

## 2024-01-07 ENCOUNTER — Ambulatory Visit: Payer: 59 | Admitting: Dermatology

## 2024-01-07 VITALS — BP 137/87 | HR 94

## 2024-01-07 DIAGNOSIS — L649 Androgenic alopecia, unspecified: Secondary | ICD-10-CM | POA: Diagnosis not present

## 2024-01-07 DIAGNOSIS — L6681 Central centrifugal cicatricial alopecia: Secondary | ICD-10-CM | POA: Diagnosis not present

## 2024-01-07 DIAGNOSIS — Z8349 Family history of other endocrine, nutritional and metabolic diseases: Secondary | ICD-10-CM | POA: Insufficient documentation

## 2024-01-07 DIAGNOSIS — E669 Obesity, unspecified: Secondary | ICD-10-CM | POA: Insufficient documentation

## 2024-01-07 MED ORDER — SAFETY SEAL MISCELLANEOUS MISC: 1.0000 | Freq: Every day | 11 refills | Status: DC

## 2024-01-07 MED ORDER — SAFETY SEAL MISCELLANEOUS MISC: 1.0000 | Freq: Every morning | 11 refills | Status: DC

## 2024-01-07 NOTE — Patient Instructions (Addendum)

## 2024-01-07 NOTE — Progress Notes (Signed)
   Follow-Up Visit   Subjective  Kelly Sharp is a 48 y.o. female who presents for the following: CCCA with Secondary Androgenetic Alopecia  Patient present today for follow up visit for CCCA. Patient was last evaluated on 07/09/23. At this visit patient was prescribed AA Gel(Minox/Clobetasol) along with otc supplement (Vivascal and Collagen). Patient reports sxs are improving. Patient denies medication changes.  The following portions of the chart were reviewed this encounter and updated as appropriate: medications, allergies, medical history  Review of Systems:  No other skin or systemic complaints except as noted in HPI or Assessment and Plan.  Objective  Well appearing patient in no apparent distress; mood and affect are within normal limits.  A focused examination was performed of the following areas: Scalp  Relevant exam findings are noted in the Assessment and Plan.         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 Clobetasol has been effective in managing inflammation associated with scarring alopecia. Minoxidil has been beneficial for both the scarring and androgenetic components by stimulating blood flow and partially blocking DHT receptors. Given the anticipated increase in hormonal influences on androgenetic alopecia, addition of finasteride to the topical regimen is warranted for more potent DHT blockade and potentially improved results.  - Plan:    Continue topical minoxidil-clobetasol solution daily    Add finasteride 1 mg to topical solution    Continue current  supplement regimen:     - Viviscal     - Patient's chosen collagen supplement    Maintain daily application of topical solution for at least one full year before considering reduction to every other day    Consider potential future switch to oral minoxidil, noting risk of facial hair growth CENTRAL CENTRIFUGAL SCARRING ALOPECIA   Related Medications Safety Seal Miscellaneous MISC Apply 1 Application topically in the morning. MEDICATION NAME: Hormonic Hair Solution (Minoxidil 8%, Clobetasol 0.05%, Finasteride 1%)  Return in about 6 months (around 07/09/2024) for CCCA with secondary Androgentic Alopecia F/U.  I, Jetta Ager, am acting as Neurosurgeon for [employer], DO.  Documentation: I have reviewed the above documentation for accuracy and completeness, and I agree with the above.  Delon Lenis, DO

## 2024-01-15 ENCOUNTER — Ambulatory Visit: Admission: EM | Admit: 2024-01-15 | Discharge: 2024-01-15 | Disposition: A | Discharge status: Home / Self Care

## 2024-01-15 ENCOUNTER — Ambulatory Visit

## 2024-01-15 DIAGNOSIS — L6681 Central centrifugal cicatricial alopecia: Secondary | ICD-10-CM | POA: Insufficient documentation

## 2024-01-15 DIAGNOSIS — U071 COVID-19: Secondary | ICD-10-CM

## 2024-01-15 LAB — POC SARS CORONAVIRUS 2 AG -  ED: SARS Coronavirus 2 Ag: POSITIVE — AB

## 2024-01-15 MED ORDER — PROMETHAZINE-DM 6.25-15 MG/5ML PO SYRP: 5.0000 mL | ORAL_SOLUTION | Freq: Four times a day (QID) | ORAL | 0 refills | Status: AC | PRN

## 2024-01-15 NOTE — ED Provider Notes (Signed)
 [facility]    CSN: [id number] Arrival date & time: 01/15/24  0805      History   Chief Complaint Chief Complaint  Patient presents with   Cough   Sore Throat   Nasal Congestion    HPI Kelly Sharp is a 48 y.o. female.    Patient here today for evaluation of itchy throat, cough that started 2 days ago.  She reports that she has had congestion and headache as well.  She denies any known fever.  She has not had vomiting or diarrhea.  The history is provided by the patient.  Cough Associated symptoms: headaches and sore throat   Associated symptoms: no chills, no ear pain, no eye discharge, no fever, no shortness of breath and no wheezing   Sore Throat Associated symptoms include headaches. Pertinent negatives include no abdominal pain and no shortness of breath.    Past Medical History:  Diagnosis Date   Allergy    Anemia    Arthritis    L knee   Conjunctivitis    reported by patient at preop on 06/14/16  patient reports approx 05/30/2016    Mitral valve prolapse    ? as a teenager    Patient Active Problem List   Diagnosis Date Noted   Central centrifugal cicatricial alopecia 01/15/2024   Family history of endocrine disorder 01/07/2024   Obesity, unspecified 01/07/2024   Uterine leiomyoma 04/15/2023   S/P total abdominal hysterectomy 04/15/2023   HNP (herniated nucleus pulposus), lumbar 06/20/2016   Spinal stenosis of lumbar region 06/20/2016    Past Surgical History:  Procedure Laterality Date   ABDOMINAL HYSTERECTOMY N/A 04/15/2023   Procedure: ABDOMINAL HYSTERECTOMY WITH BILATERAL SALPINGECTOMY;  Surgeon: Henry Slough, MD;  Location: [facility] OR;  Service: Gynecology;  Laterality: N/A;   CYSTOSCOPY  04/15/2023   Procedure: CYSTOSCOPY;  Surgeon: Henry Slough, MD;  Location: [facility] OR;  Service: Gynecology;;   DECOMPRESSIVE LUMBAR LAMINECTOMY LEVEL 1 Right 06/20/2016   Procedure: MICRO LUMBAR DECOMPRESSION L5-S1 RIGHT DECOMPRESSIVE LUMBAR  LAMINECTOMY LEVEL 1;  Surgeon: Reyes Billing, MD;  Location: [facility] ORS;  Service: Orthopedics;  Laterality: Right;  Requests 2 hours   IR RADIOLOGIST EVAL & MGMT  08/14/2022   NO PAST SURGERIES      OB History   No obstetric history on file.      Home Medications    Prior to Admission medications   Medication Sig Start Date End Date Taking? Authorizing Provider  aspirin-sod bicarb-citric acid (ALKA-SELTZER) 325 MG TBEF tablet Take 325 mg by mouth every 6 (six) hours as needed.   Yes [provider]  guaiFENesin  (MUCINEX ) 600 MG 12 hr tablet Take by mouth 2 (two) times daily.   Yes [provider]  promethazine -dextromethorphan (PROMETHAZINE -DM) 6.25-15 MG/5ML syrup Take 5 mLs by mouth 4 (four) times daily as needed for cough. 01/15/24  Yes Billy Asberry FALCON, PA-C  Cholecalciferol (VITAMIN D3) 250 MCG (10000 UT) TABS Take 2,000 Int'l Units/day by mouth daily. 04/17/23   Henry Slough, MD  ibuprofen  (ADVIL ) 600 MG tablet Take 1 tablet (600 mg total) by mouth every 6 (six) hours as needed. 04/17/23   Henry Slough, MD  Multiple Vitamin (MULTIVITAMIN WITH MINERALS) TABS tablet Take 1 tablet by mouth at bedtime. May resume 5 days post-op - X Factor Plus 04/17/23   Henry Slough, MD  OVER THE COUNTER MEDICATION Take 1 tablet by mouth 2 (two) times daily. Vivisal Hair growth supplement    [provider]  oxyCODONE  (  OXY IR/ROXICODONE ) 5 MG immediate release tablet Take 1 tablet (5 mg total) by mouth every 6 (six) hours as needed for moderate pain (pain score 4-6), severe pain (pain score 7-10) or breakthrough pain. 04/17/23   Henry Slough, MD  Probiotic Product (PROBIOTIC PO) Take 2 capsules by mouth at bedtime. Plexus ProBio 5    [provider]  Safety Seal Miscellaneous MISC Apply 1 Application topically in the morning. MEDICATION NAME: Hormonic Hair Solution (Minoxidil 8%, Clobetasol 0.05%, Finasteride 1%) 01/07/24   Alm Delon SAILOR, DO  Zinc 30 MG CAPS  Take 30 mg by mouth daily.    [provider]    Family History Family History  Problem Relation Age of Onset   Thyroid  disease Mother    Diabetes Father    Hyperlipidemia Father    Diabetes Maternal Grandmother    Diabetes Paternal Grandmother     Social History Social History   Tobacco Use   Smoking status: Never    Passive exposure: Never   Smokeless tobacco: Never  Vaping Use   Vaping status: Never Used  Substance Use Topics   Alcohol use: Yes    Comment: occassionally   Drug use: No     Allergies   Patient has no known allergies.   Review of Systems Review of Systems  Constitutional:  Negative for chills and fever.  HENT:  Positive for congestion and sore throat. Negative for ear pain.   Eyes:  Negative for discharge and redness.  Respiratory:  Positive for cough. Negative for shortness of breath and wheezing.   Gastrointestinal:  Negative for abdominal pain, diarrhea, nausea and vomiting.  Neurological:  Positive for headaches.     Physical Exam Triage Vital Signs ED Triage Vitals  Encounter Vitals Group     BP      Girls Systolic BP Percentile      Girls Diastolic BP Percentile      Boys Systolic BP Percentile      Boys Diastolic BP Percentile      Pulse      Resp      Temp      Temp src      SpO2      Weight      Height      Head Circumference      Peak Flow      Pain Score      Pain Loc      Pain Education      Exclude from Growth Chart    No data found.  Updated Vital Signs BP 112/72 (BP Location: Left Arm)   Pulse 96   Temp 99.2 F (37.3 C) (Oral)   Resp 18   Ht 5' 4 (1.626 m)   Wt 170 lb (77.1 kg)   LMP 06/14/2016 (Approximate) Comment: Neg rine preg today 06/14/16  SpO2 96%   BMI 29.18 kg/m   Visual Acuity Right Eye Distance:   Left Eye Distance:   Bilateral Distance:    Right Eye Near:   Left Eye Near:    Bilateral Near:     Physical Exam Vitals and nursing note reviewed.  Constitutional:       General: She is not in acute distress.    Appearance: Normal appearance. She is not ill-appearing.  HENT:     Head: Normocephalic and atraumatic.     Nose: Congestion present.     Mouth/Throat:     Mouth: Mucous membranes are moist.     Pharynx:  No oropharyngeal exudate or posterior oropharyngeal erythema.  Eyes:     Conjunctiva/sclera: Conjunctivae normal.  Cardiovascular:     Rate and Rhythm: Normal rate and regular rhythm.     Heart sounds: Normal heart sounds. No murmur heard. Pulmonary:     Effort: Pulmonary effort is normal. No respiratory distress.     Breath sounds: Normal breath sounds. No wheezing, rhonchi or rales.  Skin:    General: Skin is warm and dry.  Neurological:     Mental Status: She is alert.  Psychiatric:        Mood and Affect: Mood normal.        Thought Content: Thought content normal.      UC Treatments / Results  Labs (all labs ordered are listed, but only abnormal results are displayed) Labs Reviewed  POC SARS CORONAVIRUS 2 AG -  ED - Abnormal; Notable for the following components:      Result Value   SARS Coronavirus 2 Ag Positive (*)    All other components within normal limits    EKG   Radiology No results found.  Procedures Procedures (including critical care time)  Medications Ordered in UC Medications - No data to display  Initial Impression / Assessment and Plan / UC Course  I have reviewed the triage vital signs and the nursing notes.  Pertinent labs & imaging results that were available during my care of the patient were reviewed by me and considered in my medical decision making (see chart for details).    COVID screening positive.  Discussed antiviral therapy but patient would like to defer which seems reasonable as symptoms are very mild at this time.  Recommended symptomatic treatment and cough syrup prescribed at patient's request.  Follow-up if no gradual improvement or with any further concerns.  Final Clinical  Impressions(s) / UC Diagnoses   Final diagnoses:  COVID-19   Discharge Instructions   None    ED Prescriptions     Medication Sig Dispense Auth. Provider   promethazine -dextromethorphan (PROMETHAZINE -DM) 6.25-15 MG/5ML syrup Take 5 mLs by mouth 4 (four) times daily as needed for cough. 118 mL Billy Asberry FALCON, PA-C      PDMP not reviewed this encounter.   Billy Asberry FALCON, PA-C 01/15/24 1220

## 2024-01-15 NOTE — ED Triage Notes (Signed)
 This started Sunday with itchy throat, then clearing of throat to cough, this continued on Monday with Cough worse, now having congestion/ha and sinus pressure/ache. No fever known.

## 2024-06-12 ENCOUNTER — Other Ambulatory Visit: Payer: Self-pay | Admitting: Obstetrics and Gynecology

## 2024-06-12 DIAGNOSIS — E041 Nontoxic single thyroid nodule: Secondary | ICD-10-CM

## 2024-07-09 ENCOUNTER — Ambulatory Visit: Admitting: Dermatology

## 2024-07-13 ENCOUNTER — Encounter: Payer: Self-pay | Admitting: Dermatology

## 2024-07-13 ENCOUNTER — Ambulatory Visit: Admitting: Dermatology

## 2024-07-13 DIAGNOSIS — Z79899 Other long term (current) drug therapy: Secondary | ICD-10-CM

## 2024-07-13 DIAGNOSIS — L649 Androgenic alopecia, unspecified: Secondary | ICD-10-CM

## 2024-07-13 DIAGNOSIS — L6681 Central centrifugal cicatricial alopecia: Secondary | ICD-10-CM

## 2024-07-13 MED ORDER — SAFETY SEAL MISCELLANEOUS MISC: 1.0000 | Freq: Every morning | 11 refills | Status: AC

## 2024-07-13 NOTE — Progress Notes (Signed)
" ° °  Follow-Up Visit  Patient (and/or pt guardian) consented to the use of AI-assisted tools for note generation.    Subjective  Kelly Sharp is a 49 y.o. female who presents for the following: CCCA/Androgenetic Alopecia   Patient was last evaluated on 01/07/24.  At this visit Finasteride 0.05% was added to patients compound of minoxidil 8% and clobetasol 0.05% Patient was advised to continue with Viviscal and collagen supplements Discussed potentially switching to oral minoxidil Patient reports sxs are better. Patient reports that she has noticed new hair growth and areas seem a little thicker than before Patient reports she has only been using her previous rx of just minoxidil and clobetasol due to concerns about her hormones with Finasteride. Patient reports she is using Viviscal and collagen supplements  Patient denies medication changes.  The following portions of the chart were reviewed this encounter and updated as appropriate: medications, allergies, medical history  Review of Systems:  No other skin or systemic complaints except as noted in HPI or Assessment and Plan.  Objective  Well appearing patient in no apparent distress; mood and affect are within normal limits.  A focused examination was performed of the following areas: scalp  Relevant exam findings are noted in the Assessment and Plan.             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 Continued use of topical finasteride and minoxidil is contributing to hair growth and prevention of further miniaturization of hair follicles. Clobetasol is addressing inflammation associated with CCCA. No significant systemic absorption of topical finasteride, making it safe for use unless  actively trying to conceive or undergoing breast cancer treatment. Emphasized the importance of consistent use to maintain results and prevent further hair loss.  Treatment Plan: Continue with Hormonic hair solution (Minoxidil 8%, Clobetasol 0.05% and Finasteride 0.05%) daily Continue with collagen supplement and Viviscal   Long term medication management.  Patient is using long term (months to years) prescription medication  to control their dermatologic condition.  These medications require periodic monitoring to evaluate for efficacy and side effects and may require periodic laboratory monitoring.   CENTRAL CENTRIFUGAL SCARRING ALOPECIA   This Visit - Safety Seal Miscellaneous MISC - Apply 1 Application topically in the morning. MEDICATION NAME: Hormonic Hair Solution (Minoxidil 8%, Clobetasol 0.05%, Finasteride 1%)  Return in 9 months (on 04/12/2025) for CCCA/Androgenetic Alopecia f/u.  LILLETTE Lyle Cords, am acting as a neurosurgeon for [employer], DO .   Documentation: I have reviewed the above documentation for accuracy and completeness, and I agree with the above.  Delon Lenis, DO   "

## 2024-07-13 NOTE — Patient Instructions (Signed)

## 2024-07-27 ENCOUNTER — Telehealth: Payer: Self-pay

## 2025-04-13 ENCOUNTER — Ambulatory Visit: Admitting: Dermatology
